# Patient Record
Sex: Female | Born: 1975 | Race: White | Hispanic: No | Marital: Married | Smoking: Former smoker
Health system: Southern US, Community
[De-identification: ages and names within clinical notes are randomized; demographics above are authoritative.]

## PROBLEM LIST (undated history)

## (undated) DIAGNOSIS — T7840XA Allergy, unspecified, initial encounter: Secondary | ICD-10-CM

## (undated) DIAGNOSIS — Q231 Congenital insufficiency of aortic valve: Secondary | ICD-10-CM

## (undated) DIAGNOSIS — F410 Panic disorder [episodic paroxysmal anxiety] without agoraphobia: Secondary | ICD-10-CM

## (undated) DIAGNOSIS — E785 Hyperlipidemia, unspecified: Secondary | ICD-10-CM

## (undated) DIAGNOSIS — I493 Ventricular premature depolarization: Secondary | ICD-10-CM

## (undated) DIAGNOSIS — Q2381 Bicuspid aortic valve: Secondary | ICD-10-CM

## (undated) DIAGNOSIS — I77810 Thoracic aortic ectasia: Secondary | ICD-10-CM

## (undated) DIAGNOSIS — F419 Anxiety disorder, unspecified: Secondary | ICD-10-CM

## (undated) DIAGNOSIS — I359 Nonrheumatic aortic valve disorder, unspecified: Secondary | ICD-10-CM

## (undated) DIAGNOSIS — R011 Cardiac murmur, unspecified: Secondary | ICD-10-CM

## (undated) DIAGNOSIS — F32A Depression, unspecified: Secondary | ICD-10-CM

## (undated) DIAGNOSIS — R87629 Unspecified abnormal cytological findings in specimens from vagina: Secondary | ICD-10-CM

## (undated) DIAGNOSIS — F329 Major depressive disorder, single episode, unspecified: Secondary | ICD-10-CM

## (undated) DIAGNOSIS — Z298 Encounter for other specified prophylactic measures: Secondary | ICD-10-CM

## (undated) DIAGNOSIS — Z2989 Encounter for other specified prophylactic measures: Secondary | ICD-10-CM

## (undated) DIAGNOSIS — I10 Essential (primary) hypertension: Secondary | ICD-10-CM

## (undated) HISTORY — DX: Encounter for other specified prophylactic measures: Z29.89

## (undated) HISTORY — DX: Unspecified abnormal cytological findings in specimens from vagina: R87.629

## (undated) HISTORY — DX: Allergy, unspecified, initial encounter: T78.40XA

## (undated) HISTORY — DX: Thoracic aortic ectasia: I77.810

## (undated) HISTORY — DX: Ventricular premature depolarization: I49.3

## (undated) HISTORY — DX: Essential (primary) hypertension: I10

## (undated) HISTORY — DX: Bicuspid aortic valve: Q23.81

## (undated) HISTORY — DX: Hyperlipidemia, unspecified: E78.5

## (undated) HISTORY — PX: COLPOSCOPY: SHX161

## (undated) HISTORY — DX: Anxiety disorder, unspecified: F41.9

## (undated) HISTORY — PX: OTHER SURGICAL HISTORY: SHX169

## (undated) HISTORY — DX: Congenital insufficiency of aortic valve: Q23.1

## (undated) HISTORY — DX: Panic disorder (episodic paroxysmal anxiety): F41.0

## (undated) HISTORY — DX: Nonrheumatic aortic valve disorder, unspecified: I35.9

## (undated) HISTORY — DX: Cardiac murmur, unspecified: R01.1

## (undated) HISTORY — DX: Encounter for other specified prophylactic measures: Z29.8

---

## 2004-05-24 ENCOUNTER — Ambulatory Visit: Payer: Self-pay | Admitting: Cardiology

## 2004-06-04 ENCOUNTER — Ambulatory Visit: Payer: Self-pay | Admitting: Cardiology

## 2004-06-04 ENCOUNTER — Ambulatory Visit: Payer: Self-pay

## 2004-06-15 ENCOUNTER — Ambulatory Visit: Payer: Self-pay | Admitting: Cardiology

## 2004-06-22 ENCOUNTER — Ambulatory Visit: Payer: Self-pay | Admitting: Cardiology

## 2005-01-28 HISTORY — PX: BREAST CYST ASPIRATION: SHX578

## 2005-09-05 ENCOUNTER — Encounter: Admission: RE | Admit: 2005-09-05 | Discharge: 2005-09-05 | Payer: Self-pay | Admitting: Family Medicine

## 2006-09-08 ENCOUNTER — Ambulatory Visit (HOSPITAL_COMMUNITY): Admission: RE | Admit: 2006-09-08 | Discharge: 2006-09-08 | Payer: Self-pay | Admitting: Obstetrics and Gynecology

## 2006-09-08 ENCOUNTER — Encounter: Payer: Self-pay | Admitting: Obstetrics and Gynecology

## 2007-05-13 ENCOUNTER — Other Ambulatory Visit: Admission: RE | Admit: 2007-05-13 | Discharge: 2007-05-13 | Payer: Self-pay | Admitting: Obstetrics & Gynecology

## 2007-11-24 ENCOUNTER — Ambulatory Visit: Payer: Self-pay | Admitting: Obstetrics & Gynecology

## 2007-11-24 ENCOUNTER — Inpatient Hospital Stay (HOSPITAL_COMMUNITY): Admission: AD | Admit: 2007-11-24 | Discharge: 2007-11-24 | Payer: Self-pay | Admitting: Obstetrics & Gynecology

## 2007-12-04 ENCOUNTER — Inpatient Hospital Stay (HOSPITAL_COMMUNITY): Admission: AD | Admit: 2007-12-04 | Discharge: 2007-12-04 | Payer: Self-pay | Admitting: Obstetrics & Gynecology

## 2007-12-04 ENCOUNTER — Inpatient Hospital Stay (HOSPITAL_COMMUNITY): Admission: AD | Admit: 2007-12-04 | Discharge: 2007-12-07 | Payer: Self-pay | Admitting: Obstetrics & Gynecology

## 2007-12-04 ENCOUNTER — Ambulatory Visit: Payer: Self-pay | Admitting: Obstetrics and Gynecology

## 2009-04-14 LAB — HM PAP SMEAR: HM Pap smear: NORMAL

## 2010-02-07 ENCOUNTER — Encounter: Payer: Self-pay | Admitting: Cardiovascular Disease

## 2010-02-07 ENCOUNTER — Ambulatory Visit
Admission: RE | Admit: 2010-02-07 | Discharge: 2010-02-07 | Payer: Self-pay | Source: Home / Self Care | Attending: Cardiovascular Disease | Admitting: Cardiovascular Disease

## 2010-02-07 ENCOUNTER — Encounter: Payer: Self-pay | Admitting: Cardiology

## 2010-02-07 DIAGNOSIS — Q231 Congenital insufficiency of aortic valve: Secondary | ICD-10-CM | POA: Insufficient documentation

## 2010-02-07 DIAGNOSIS — I4949 Other premature depolarization: Secondary | ICD-10-CM | POA: Insufficient documentation

## 2010-02-22 ENCOUNTER — Ambulatory Visit: Admission: RE | Admit: 2010-02-22 | Discharge: 2010-02-22 | Payer: Self-pay | Source: Home / Self Care

## 2010-02-22 ENCOUNTER — Other Ambulatory Visit: Payer: Self-pay | Admitting: Cardiovascular Disease

## 2010-03-01 NOTE — Assessment & Plan Note (Signed)
Summary: EC6/AMD   Visit Type:  Initial Consult Primary Provider:  Dr. Tanya Nones [facility] Medicine)  CC:  Establish care for leaky valve Nancy Welch), PVC's, and heart murmur.  She has been on medication for the PVC's in the past but not taking anything at this time. Marland Kitchen  History of Present Illness: Nancy Welch is a 35 year old woman who was seen many years ago by [facility] cardiology and at that time had an echocardiogram. Echocardiogram confirmed bicuspid aortic valve by her report. She also was found to have frequent PVCs. She presents to reestablish care. It has been 10 years.  She would like an echocardiogram to reevaluate her aortic valve. She is going to start running significant distances and would like to make sure that everything is okay prior to her heavy training. She has not been having shortness of breath. She does have frequent PVCs. She cannot drink caffeine and has to minimize stress, avoid cold medicines.  EKG shows normal sinus rhythm with rate of 71 beats per minute, no significant ST or T wave changes  Preventive Screening-Counseling & Management  Alcohol-Tobacco     Smoking Status: quit  Caffeine-Diet-Exercise     Does Patient Exercise: yes      Drug Use:  no.    Current Medications (verified): 1)  Errin 0.35 Mg Tabs (Norethindrone) .... One Tablet Once Daily 2)  Sertraline Hcl 50 Mg Tabs (Sertraline Hcl) .... One Tablet Once Daily 3)  Centrum Silver  Tabs (Multiple Vitamins-Minerals) .... One Tablet Once Daily 4)  Vitamin D3 2000 Unit Caps (Cholecalciferol) .... Daily 5)  Fish Oil 1000 Mg Caps (Omega-3 Fatty Acids) .... One Tablet Once Daily  Allergies (verified): No Known Drug Allergies  Past History:  Family History: Last updated: 02/07/2010 Father: Living; prostate cancer age 69 Mother: Living; rheumatoid arthritis age 62  Social History: Last updated: 02/07/2010 Married  Full Time-- Designer, industrial/product. Tobacco Use - Former. Smoked 1/2 PPD x 5 yrs.   Quit 2005. Alcohol Use - yes--rare Regular Exercise - yes--Treadmill 30 minutes of running. Drug Use - no  Risk Factors: Exercise: yes (02/07/2010)  Risk Factors: Smoking Status: quit (02/07/2010)  Past Medical History: heart murmur PVC's  Bi-Valve disorder--Echo 2001 in [place], [place].  Past Surgical History: ovarian cyst --surgical removed (left side)  Family History: Father: Living; prostate cancer age 70 Mother: Living; rheumatoid arthritis age 35  Social History: Married  Full Time-- Designer, industrial/product. Tobacco Use - Former. Smoked 1/2 PPD x 5 yrs.  Quit 2005. Alcohol Use - yes--rare Regular Exercise - yes--Treadmill 30 minutes of running. Drug Use - no Smoking Status:  quit Does Patient Exercise:  yes Drug Use:  no  Review of Systems  The patient denies fever, weight loss, weight gain, vision loss, decreased hearing, hoarseness, chest pain, syncope, dyspnea on exertion, peripheral edema, prolonged cough, abdominal pain, incontinence, muscle weakness, depression, and enlarged lymph nodes.    Vital Signs:  Patient profile:   35 year old female Height:      70 inches Weight:      146 pounds BMI:     21.02 Pulse rate:   71 / minute BP sitting:   124 / 88  (left arm) Cuff size:   regular  Vitals Entered By: Bishop Dublin, CMA (February 07, 2010 3:19 PM)  Physical Exam  General:  Well developed, well nourished, in no acute distress. Head:  normocephalic and atraumatic Neck:  Neck supple, no JVD. No masses, thyromegaly or abnormal cervical nodes. Lungs:  Clear bilaterally to auscultation and percussion. Heart:  Non-displaced PMI, chest non-tender; regular rate and rhythm, S1, S2 with I/VI SEM RSB, no rubs or gallops. Carotid upstroke normal, no bruits, Pedals normal pulses. No edema, no varicosities. Abdomen:  Bowel sounds positive; abdomen soft and non-tender without masses Msk:  Back normal, normal gait. Muscle strength and tone normal. Pulses:  pulses normal in all  4 extremities Extremities:  No clubbing or cyanosis. Neurologic:  Alert and oriented x 3. Skin:  Intact without lesions or rashes. Psych:  Normal affect.   Impression & Recommendations:  Problem # 1:  BICUSPID AORTIC VALVE (ICD-746.4) patient reports a history of bicuspid aortic valve. We will try to obtain old records prior chart. We will order a repeat echocardiogram to evaluate the valve, aortic root appeared she reports a history of aortic valve regurgitation, mild.   Orders: Echocardiogram (Echo)  Problem # 2:  PREMATURE VENTRICULAR CONTRACTIONS (ICD-427.69) She does not want any beta blockers for her PVCs. She is known to work around them and avoid caffeine.  Orders: EKG w/ Interpretation (93000)  Patient Instructions: 1)  Your physician recommends that you follow up as needed. 2)  Your physician has requested that you have an echocardiogram.  Echocardiography is a painless test that uses sound waves to create images of your heart. It provides your doctor with information about the size and shape of your heart and how well your heart's chambers and valves are working.  This procedure takes approximately one hour. There are no restrictions for this procedure.

## 2010-03-01 NOTE — Letter (Signed)
Summary: Medical Record Release  Medical Record Release   Imported By: Harlon Flor 02/20/2010 15:05:01  _____________________________________________________________________  External Attachment:    Type:   Image     Comment:   External Document

## 2010-03-01 NOTE — Letter (Signed)
Summary: Medical Record Release  Medical Record Release   Imported By: Harlon Flor 02/20/2010 15:21:17  _____________________________________________________________________  External Attachment:    Type:   Image     Comment:   External Document

## 2010-03-20 ENCOUNTER — Encounter: Payer: Self-pay | Admitting: Cardiovascular Disease

## 2010-03-21 NOTE — Letter (Signed)
Summary: Medical Record Release - [facility]  Medical Record Release - Marai Betka   Imported By: Harlon Flor 03/15/2010 09:57:57  _____________________________________________________________________  External Attachment:    Type:   Image     Comment:   External Document

## 2010-03-27 NOTE — Letter (Signed)
Summary: Generic Engineer, agricultural at [address]   [place], [place] [address]   Phone: [phone]  Fax: [phone]        March 20, 2010 MRN: [id number]    [place] [address] [place], [place]  [address]    Dear Ms. PATTERSON,   Dr.Brentlee Sciara said you are cleared for exercise. If you have any questions or concerns feel free to give me a call back at [phone].         Sincerely,  Lysbeth Galas, CMA  This letter has been electronically signed by your physician.

## 2010-06-12 NOTE — H&P (Signed)
NAMEKRITIKA, STUKES            ACCOUNT NO.:  [id number]   MEDICAL RECORD NO.:  [id number]          PATIENT TYPE:  AMB   LOCATION:  DAY                           FACILITY:  [facility]   PHYSICIAN:  Tilda Burrow, M.D. DATE OF BIRTH:  January 24, 1976   DATE OF ADMISSION:  09/08/2006  DATE OF DISCHARGE:  LH                              HISTORY & PHYSICAL   ADMISSION DIAGNOSIS:  Pregnancy at 67 weeks' gestation, missed abortion  (miscarriage).   HISTORY OF PRESENT ILLNESS:  This 35 year old female, G2, P0, AB1, LMP  Jun 18, 2006, placing [facility] March 26, 2007, is seen in our office on  September 05, 2006, where ultrasound confirms missed abortion with a crown-  rump length of 2.2 cm, absent fetal heart motion on ultrasound.  There  has been essentially no fetal growth since August 22, 2006, when the fetal  heart motion was clearly present.  The patient understands the  inevitability pregnancy loss, has considered her options and desires to  proceed as quick as possible to suction D&C.  This is scheduled for 10  a.m. on September 08, 2006, at [facility].   PRENATAL COURSE:  Prenatal course has been uneventful with blood type O  positive, urine drug screen negative, varicella immunity present, RPR  nonreactive, rubella immunity present, hemoglobin 13, hematocrit 39.  Hepatitis negative, HIV nonreactive, RPR nonreactive.  Pap smear Class  I.  GC and chlamydia culture performed September 05, 2006.   PAST MEDICAL HISTORY:  Negative.   PAST SURGICAL HISTORY:  1. Left ovarian cyst removed in the past.  2. Spontaneous miscarriage in March 2008, for first pregnancy.   PAST OBSTETRICAL HISTORY:  She is a G2, P0-0-1-0.   SOCIAL HISTORY:  She is married.  Works at [employer] in [place].   PHYSICAL EXAMINATION:  VITAL SIGNS:  Physical exam performed by Dr. Despina Hidden  shows height 5 feet 10 inches, weight 149, blood pressure 100/80.  HEENT:  Pupils equal, round and reactive.  Extraocular intact.  NECK:   Supple.  CHEST:  Clear to auscultation.  ABDOMEN:  Nontender.  PELVIC:  Cervix closed.  Uterus anteflexed with large gestational sac  with 2.2 cm fetal pole with absent fetal heart motion and smaller than  normal yoke sac.   PLAN:  Suction D&C on September 08, 2006.      Tilda Burrow, M.D.  Electronically Signed     JVF/MEDQ  D:  09/05/2006  T:  09/05/2006  Job:  [id number]   cc:   [facility]  Fax: [phone]   [facility]

## 2010-06-12 NOTE — Op Note (Signed)
[name], [name]            ACCOUNT NO.:  [id number]   MEDICAL RECORD NO.:  [id number]          PATIENT TYPE:  AMB   LOCATION:  DAY                           FACILITY:  [facility]   PHYSICIAN:  Tilda Burrow, M.D. DATE OF BIRTH:  1975/10/30   DATE OF PROCEDURE:  09/08/2006  DATE OF DISCHARGE:                               OPERATIVE REPORT   PREOPERATIVE DIAGNOSIS:  Missed abortion, [redacted] weeks gestation.   POSTOPERATIVE DIAGNOSIS:  Missed abortion, [redacted] weeks gestation.   PROCEDURE:  Suction, dilation and curettage.   SURGEON:  Tilda Burrow, M.D.   ASSISTANT:  None.   ANESTHESIA:  General anesthesia with LMA anesthesia.   COMPLICATIONS:  None.   FINDINGS:  Retroverted uterus sounding to 11 cm pre-procedure and post-  procedure.  A generous amount of fluid consistent with ultrasound, as  well as products of conception.  Soft boggy uterus with increased  tendency to bleed.  No suspicion of perforation.   DESCRIPTION OF PROCEDURE:  The patient was taken to the operating room  and prepped and draped for a vaginal procedure.  A single tooth  tenaculum was used to grasp the cervix.  She had an elongated vaginal  length.  The cervix was grasped.  Prior to this an examination under  anesthesia confirmed the uterus to be in a slightly retroverted position  and consistent with approximately 11 weeks size.  The uterus sounded to  11 cm, allowed serial dilation to 31-French with easy entry of the 9 mm  curved suction curet into the uterine cavity.  A suction curettage was  then performed in a circumferential fashion obtaining a large amount of  fluid and membrane remnants.  This portion of the procedure went very  straightforward and we extracted the membrane fragments as expected.  The uterus stayed rather soft and floppy and did not contract down as  much as before.  The curettage was repeated x1.  No additional tissue  was obtained.  The uterus had a somewhat heart-shaped contour  to tactile  sensation.  The smooth sharp curettage was then introduced and a brief  curettage performed in four quadrants, obtaining a generous amount of  blood but no tissue fragments and no sensation of rough tissue.  IV  Oxytocin was administered due to the tendency toward a boggy floppy  uterus.  This seemed to improve the oozing which was greater than  normal, with an estimated blood loss of 200 mL.  Repeat examination  under anesthesia confirmed that the uterus was indeed in a similar  position as before and subjectively slightly smaller, but softer than is  the case in many patients.  This was consider a normal variation.   The patient was therefore sent to the recovery room.  Prior to  completing the procedure, a para-cervical block with 20 mL of Marcaine  solution was performed.  The bleeding was essentially stopped by the  time she went to the recovery room, where she was examined and was quite  comfortable, with the sponge and needle counts correct.   ADDENDUM:  Her blood type is confirmed as  Rh, O-positive.      Tilda Burrow, M.D.  Electronically Signed     JVF/MEDQ  D:  09/08/2006  T:  09/09/2006  Job:  [id number]

## 2010-07-31 ENCOUNTER — Encounter: Payer: Self-pay | Admitting: Family Medicine

## 2010-07-31 DIAGNOSIS — Z298 Encounter for other specified prophylactic measures: Secondary | ICD-10-CM | POA: Insufficient documentation

## 2010-07-31 DIAGNOSIS — F419 Anxiety disorder, unspecified: Secondary | ICD-10-CM | POA: Insufficient documentation

## 2010-07-31 DIAGNOSIS — I493 Ventricular premature depolarization: Secondary | ICD-10-CM | POA: Insufficient documentation

## 2010-08-16 ENCOUNTER — Encounter: Payer: Self-pay | Admitting: Cardiovascular Disease

## 2010-10-30 LAB — URINALYSIS, ROUTINE W REFLEX MICROSCOPIC
Bilirubin Urine: NEGATIVE
Glucose, UA: NEGATIVE
Ketones, ur: 15 — AB
Leukocytes, UA: NEGATIVE
Nitrite: NEGATIVE
Protein, ur: NEGATIVE
Specific Gravity, Urine: 1.01
Urobilinogen, UA: 0.2
pH: 7.5

## 2010-10-30 LAB — CBC
HCT: 35.9 — ABNORMAL LOW
HCT: 38
Hemoglobin: 12
Hemoglobin: 12.7
MCHC: 33.4
MCHC: 33.5
MCV: 89.4
MCV: 89.4
Platelets: 220
Platelets: 236
RBC: 4.02
RBC: 4.25
RDW: 14.5
RDW: 14.7
WBC: 15.8 — ABNORMAL HIGH
WBC: 9.6

## 2010-10-30 LAB — WET PREP, GENITAL
Clue Cells Wet Prep HPF POC: NONE SEEN
Trich, Wet Prep: NONE SEEN
Yeast Wet Prep HPF POC: NONE SEEN

## 2010-10-30 LAB — RPR
RPR Ser Ql: NONREACTIVE
RPR Ser Ql: NONREACTIVE

## 2010-10-30 LAB — COMPREHENSIVE METABOLIC PANEL
ALT: 10
AST: 17
Albumin: 2.8 — ABNORMAL LOW
Alkaline Phosphatase: 122 — ABNORMAL HIGH
BUN: 3 — ABNORMAL LOW
CO2: 24
Calcium: 9
Chloride: 103
Creatinine, Ser: 0.46
GFR calc Af Amer: >60
GFR calc non Af Amer: >60
Glucose, Bld: 92
Potassium: 3.7
Sodium: 134 — ABNORMAL LOW
Total Bilirubin: 0.9
Total Protein: 5.7 — ABNORMAL LOW

## 2010-10-30 LAB — URINE MICROSCOPIC-ADD ON

## 2010-10-30 LAB — URIC ACID: Uric Acid, Serum: 3.7

## 2010-11-12 LAB — HEMOGLOBIN AND HEMATOCRIT, BLOOD
HCT: 36
Hemoglobin: 12.5

## 2012-04-05 ENCOUNTER — Emergency Department (HOSPITAL_COMMUNITY)
Admission: EM | Admit: 2012-04-05 | Discharge: 2012-04-06 | Disposition: A | Discharge status: Home / Self Care | Payer: No Typology Code available for payment source | Attending: Emergency Medicine | Admitting: Emergency Medicine

## 2012-04-05 ENCOUNTER — Encounter (HOSPITAL_COMMUNITY): Payer: Self-pay | Admitting: Emergency Medicine

## 2012-04-05 DIAGNOSIS — I4949 Other premature depolarization: Secondary | ICD-10-CM | POA: Insufficient documentation

## 2012-04-05 DIAGNOSIS — I359 Nonrheumatic aortic valve disorder, unspecified: Secondary | ICD-10-CM | POA: Insufficient documentation

## 2012-04-05 DIAGNOSIS — Z79899 Other long term (current) drug therapy: Secondary | ICD-10-CM | POA: Insufficient documentation

## 2012-04-05 DIAGNOSIS — F411 Generalized anxiety disorder: Secondary | ICD-10-CM | POA: Insufficient documentation

## 2012-04-05 DIAGNOSIS — F329 Major depressive disorder, single episode, unspecified: Secondary | ICD-10-CM | POA: Insufficient documentation

## 2012-04-05 DIAGNOSIS — R011 Cardiac murmur, unspecified: Secondary | ICD-10-CM | POA: Insufficient documentation

## 2012-04-05 DIAGNOSIS — Z87891 Personal history of nicotine dependence: Secondary | ICD-10-CM | POA: Insufficient documentation

## 2012-04-05 DIAGNOSIS — S46909A Unspecified injury of unspecified muscle, fascia and tendon at shoulder and upper arm level, unspecified arm, initial encounter: Secondary | ICD-10-CM | POA: Insufficient documentation

## 2012-04-05 DIAGNOSIS — S161XXA Strain of muscle, fascia and tendon at neck level, initial encounter: Secondary | ICD-10-CM

## 2012-04-05 DIAGNOSIS — F3289 Other specified depressive episodes: Secondary | ICD-10-CM | POA: Insufficient documentation

## 2012-04-05 DIAGNOSIS — Y92410 Unspecified street and highway as the place of occurrence of the external cause: Secondary | ICD-10-CM | POA: Insufficient documentation

## 2012-04-05 DIAGNOSIS — S139XXA Sprain of joints and ligaments of unspecified parts of neck, initial encounter: Secondary | ICD-10-CM | POA: Insufficient documentation

## 2012-04-05 DIAGNOSIS — Y9389 Activity, other specified: Secondary | ICD-10-CM | POA: Insufficient documentation

## 2012-04-05 DIAGNOSIS — Z8679 Personal history of other diseases of the circulatory system: Secondary | ICD-10-CM | POA: Insufficient documentation

## 2012-04-05 DIAGNOSIS — S4980XA Other specified injuries of shoulder and upper arm, unspecified arm, initial encounter: Secondary | ICD-10-CM | POA: Insufficient documentation

## 2012-04-05 HISTORY — DX: Depression, unspecified: F32.A

## 2012-04-05 HISTORY — DX: Major depressive disorder, single episode, unspecified: F32.9

## 2012-04-05 MED ORDER — OXYCODONE-ACETAMINOPHEN 5-325 MG PO TABS
2.0000 | ORAL_TABLET | Freq: Once | ORAL | Status: AC
  Administered 2012-04-06: 2 via ORAL
  Filled 2012-04-05: qty 2

## 2012-04-05 MED ORDER — DIAZEPAM 5 MG PO TABS: 5.0000 mg | ORAL_TABLET | Freq: Four times a day (QID) | ORAL | Status: DC | PRN

## 2012-04-05 MED ORDER — DIAZEPAM 5 MG PO TABS
5.0000 mg | ORAL_TABLET | Freq: Once | ORAL | Status: AC
  Administered 2012-04-06: 5 mg via ORAL
  Filled 2012-04-05: qty 1

## 2012-04-05 MED ORDER — IBUPROFEN 600 MG PO TABS: 600.0000 mg | ORAL_TABLET | Freq: Four times a day (QID) | ORAL | Status: DC | PRN

## 2012-04-05 MED ORDER — KETOROLAC TROMETHAMINE 30 MG/ML IJ SOLN
30.0000 mg | Freq: Once | INTRAMUSCULAR | Status: AC
  Administered 2012-04-06: 30 mg via INTRAMUSCULAR
  Filled 2012-04-05: qty 1

## 2012-04-05 MED ORDER — OXYCODONE-ACETAMINOPHEN 5-325 MG PO TABS: 2.0000 | ORAL_TABLET | Freq: Once | ORAL | Status: DC
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
## 2012-04-06 NOTE — ED Provider Notes (Signed)
Medical screening examination/treatment/procedure(s) were performed by non-physician practitioner and as supervising physician I was immediately available for consultation/collaboration.  Doug Sou, MD 04/06/12 [phone]

## 2012-04-21 ENCOUNTER — Ambulatory Visit (INDEPENDENT_AMBULATORY_CARE_PROVIDER_SITE_OTHER): Payer: 59 | Admitting: Family Medicine

## 2012-04-21 ENCOUNTER — Encounter: Payer: Self-pay | Admitting: Family Medicine

## 2012-04-21 VITALS — BP 110/68 | HR 93 | Temp 98.3°F | Resp 16 | Wt 152.0 lb

## 2012-04-21 DIAGNOSIS — S139XXA Sprain of joints and ligaments of unspecified parts of neck, initial encounter: Secondary | ICD-10-CM

## 2012-04-21 DIAGNOSIS — F0781 Postconcussional syndrome: Secondary | ICD-10-CM

## 2012-04-21 DIAGNOSIS — S060X0A Concussion without loss of consciousness, initial encounter: Secondary | ICD-10-CM

## 2012-04-21 DIAGNOSIS — S134XXA Sprain of ligaments of cervical spine, initial encounter: Secondary | ICD-10-CM
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
## 2012-04-21 NOTE — Patient Instructions (Signed)
Rest. Out of work the next week. Take motrin 800 3x/day for pain. Recheck in2 weeks if no better, sooner if worse.

## 2012-12-31 ENCOUNTER — Encounter (HOSPITAL_COMMUNITY): Payer: Self-pay | Admitting: *Deleted

## 2012-12-31 ENCOUNTER — Inpatient Hospital Stay (HOSPITAL_COMMUNITY): Payer: 59

## 2012-12-31 ENCOUNTER — Encounter (HOSPITAL_COMMUNITY): Payer: 59 | Admitting: Anesthesiology

## 2012-12-31 ENCOUNTER — Inpatient Hospital Stay (HOSPITAL_COMMUNITY): Payer: 59 | Admitting: Anesthesiology

## 2012-12-31 ENCOUNTER — Ambulatory Visit (HOSPITAL_COMMUNITY)
Admission: AD | Admit: 2012-12-31 | Discharge: 2012-12-31 | Disposition: A | Discharge status: Home / Self Care | Payer: 59 | Source: Ambulatory Visit | Attending: Obstetrics and Gynecology | Admitting: Obstetrics and Gynecology

## 2012-12-31 ENCOUNTER — Encounter (HOSPITAL_COMMUNITY)
Admission: AD | Disposition: A | Discharge status: Home / Self Care | Payer: Self-pay | Source: Ambulatory Visit | Attending: Obstetrics and Gynecology

## 2012-12-31 DIAGNOSIS — O031 Delayed or excessive hemorrhage following incomplete spontaneous abortion: Secondary | ICD-10-CM | POA: Insufficient documentation

## 2012-12-31 DIAGNOSIS — O034 Incomplete spontaneous abortion without complication: Secondary | ICD-10-CM

## 2012-12-31 HISTORY — PX: DILATION AND EVACUATION: SHX1459

## 2012-12-31 LAB — TYPE AND SCREEN
ABO/RH(D): O POS
Antibody Screen: NEGATIVE

## 2012-12-31 LAB — CBC
HCT: 28.6 % — ABNORMAL LOW (ref 36.0–46.0)
Hemoglobin: 9.6 g/dL — ABNORMAL LOW (ref 12.0–15.0)
MCH: 29.1 pg (ref 26.0–34.0)
MCHC: 33.6 g/dL (ref 30.0–36.0)
MCV: 86.7 fL (ref 78.0–100.0)
Platelets: 181 10*3/uL (ref 150–400)
RBC: 3.3 MIL/uL — ABNORMAL LOW (ref 3.87–5.11)
RDW: 12.8 % (ref 11.5–15.5)
WBC: 8.8 10*3/uL (ref 4.0–10.5)

## 2012-12-31 LAB — ABO/RH: ABO/RH(D): O POS

## 2012-12-31 LAB — HCG, QUANTITATIVE, PREGNANCY: hCG, Beta Chain, Quant, S: 26 m[IU]/mL — ABNORMAL HIGH (ref <5)

## 2012-12-31 SURGERY — DILATION AND EVACUATION, UTERUS
Anesthesia: Monitor Anesthesia Care | Site: Vagina

## 2012-12-31 MED ORDER — HYDROCODONE-ACETAMINOPHEN 5-325 MG PO TABS: 1.0000 | ORAL_TABLET | Freq: Four times a day (QID) | ORAL | Status: DC | PRN

## 2012-12-31 MED ORDER — ONDANSETRON HCL 4 MG/2ML IJ SOLN
INTRAMUSCULAR | Status: DC | PRN
  Administered 2012-12-31: 4 mg via INTRAVENOUS

## 2012-12-31 MED ORDER — MIDAZOLAM HCL 2 MG/2ML IJ SOLN
INTRAMUSCULAR | Status: DC | PRN
  Administered 2012-12-31: 1 mg via INTRAVENOUS

## 2012-12-31 MED ORDER — FAMOTIDINE IN NACL 20-0.9 MG/50ML-% IV SOLN
20.0000 mg | Freq: Once | INTRAVENOUS | Status: AC
  Administered 2012-12-31: 20 mg via INTRAVENOUS
  Filled 2012-12-31: qty 50

## 2012-12-31 MED ORDER — FENTANYL CITRATE 0.05 MG/ML IJ SOLN
25.0000 ug | INTRAMUSCULAR | Status: DC | PRN
  Administered 2012-12-31 (×2): 50 ug via INTRAVENOUS

## 2012-12-31 MED ORDER — HYDROCODONE-ACETAMINOPHEN 5-325 MG PO TABS
ORAL_TABLET | ORAL | Status: AC
  Filled 2012-12-31: qty 2

## 2012-12-31 MED ORDER — LACTATED RINGERS IV SOLN
INTRAVENOUS | Status: DC | PRN
  Administered 2012-12-31 (×3): via INTRAVENOUS

## 2012-12-31 MED ORDER — LIDOCAINE HCL (CARDIAC) 20 MG/ML IV SOLN
INTRAVENOUS | Status: DC | PRN
  Administered 2012-12-31: 20 mg via INTRAVENOUS

## 2012-12-31 MED ORDER — PROPOFOL INFUSION 10 MG/ML OPTIME
INTRAVENOUS | Status: DC | PRN
  Administered 2012-12-31: 75 ug/kg/min via INTRAVENOUS

## 2012-12-31 MED ORDER — FENTANYL CITRATE 0.05 MG/ML IJ SOLN
INTRAMUSCULAR | Status: DC | PRN
  Administered 2012-12-31 (×2): 50 ug via INTRAVENOUS

## 2012-12-31 MED ORDER — FENTANYL CITRATE 0.05 MG/ML IJ SOLN
INTRAMUSCULAR | Status: AC
  Administered 2012-12-31: 50 ug via INTRAVENOUS
  Filled 2012-12-31: qty 2

## 2012-12-31 MED ORDER — LIDOCAINE HCL 1 % IJ SOLN
INTRAMUSCULAR | Status: DC | PRN
  Administered 2012-12-31: 19 mL

## 2012-12-31 MED ORDER — PROPOFOL 10 MG/ML IV EMUL
INTRAVENOUS | Status: DC | PRN
  Administered 2012-12-31 (×5): 20 mg via INTRAVENOUS

## 2012-12-31 MED ORDER — HYDROCODONE-ACETAMINOPHEN 5-325 MG PO TABS
2.0000 | ORAL_TABLET | Freq: Once | ORAL | Status: AC
  Administered 2012-12-31: 2 via ORAL

## 2012-12-31 MED ORDER — SODIUM CHLORIDE 0.9 % IV BOLUS (SEPSIS)
1000.0000 mL | Freq: Once | INTRAVENOUS | Status: AC
  Administered 2012-12-31: 1000 mL via INTRAVENOUS

## 2012-12-31 SURGICAL SUPPLY — 21 items
CATH ROBINSON RED A/P 16FR (CATHETERS) ×2 IMPLANT
CLOTH BEACON ORANGE TIMEOUT ST (SAFETY) ×2 IMPLANT
DECANTER SPIKE VIAL GLASS SM (MISCELLANEOUS) ×2 IMPLANT
GLOVE ECLIPSE 9.0 STRL (GLOVE) ×4 IMPLANT
GOWN STRL REIN 3XL LVL4 (GOWN DISPOSABLE) ×2 IMPLANT
GOWN STRL REIN XL XLG (GOWN DISPOSABLE) ×4 IMPLANT
KIT BERKELEY 1ST TRIMESTER 3/8 (MISCELLANEOUS) ×2 IMPLANT
NDL SPNL 22GX3.5 QUINCKE BK (NEEDLE) ×1 IMPLANT
NEEDLE SPNL 22GX3.5 QUINCKE BK (NEEDLE) ×2 IMPLANT
NS IRRIG 1000ML POUR BTL (IV SOLUTION) ×2 IMPLANT
PACK VAGINAL MINOR WOMEN LF (CUSTOM PROCEDURE TRAY) ×2 IMPLANT
PAD OB MATERNITY 4.3X12.25 (PERSONAL CARE ITEMS) ×2 IMPLANT
PAD PREP 24X48 CUFFED NSTRL (MISCELLANEOUS) ×2 IMPLANT
SET BERKELEY SUCTION TUBING (SUCTIONS) ×2 IMPLANT
SYR CONTROL 10ML LL (SYRINGE) ×2 IMPLANT
TOWEL OR 17X24 6PK STRL BLUE (TOWEL DISPOSABLE) ×4 IMPLANT
VACURETTE 10 RIGID CVD (CANNULA) IMPLANT
VACURETTE 12 RIGID CVD (CANNULA) IMPLANT
VACURETTE 7MM CVD STRL WRAP (CANNULA) IMPLANT
VACURETTE 8 RIGID CVD (CANNULA) IMPLANT
VACURETTE 9 RIGID CVD (CANNULA) ×1 IMPLANT

## 2012-12-31 NOTE — Anesthesia Preprocedure Evaluation (Signed)
Anesthesia Evaluation  Patient identified by MRN, date of birth, ID band Patient awake    Reviewed: Allergy & Precautions, H&P , Patient's Chart, lab work & pertinent test results, reviewed documented beta blocker date and time   Airway Mallampati: II TM Distance: >3 FB Neck ROM: full    Dental no notable dental hx.    Pulmonary former smoker,  breath sounds clear to auscultation  Pulmonary exam normal       Cardiovascular Rhythm:regular Rate:Normal     Neuro/Psych    GI/Hepatic   Endo/Other    Renal/GU      Musculoskeletal   Abdominal   Peds  Hematology   Anesthesia Other Findings   Reproductive/Obstetrics                           Anesthesia Physical Anesthesia Plan  ASA: II  Anesthesia Plan: MAC   Post-op Pain Management:    Induction: Intravenous  Airway Management Planned: LMA, Mask and Natural Airway  Additional Equipment:   Intra-op Plan:   Post-operative Plan:   Informed Consent: I have reviewed the patients History and Physical, chart, labs and discussed the procedure including the risks, benefits and alternatives for the proposed anesthesia with the patient or authorized representative who has indicated his/her understanding and acceptance.   Dental Advisory Given  Plan Discussed with: CRNA and Surgeon  Anesthesia Plan Comments:         Anesthesia Quick Evaluation

## 2012-12-31 NOTE — MAU Note (Addendum)
PT HAS ARRIVED VIA EMS-  SHE THINKS SHE HAD A SAB ABOUT A MTH AGO.   BUT TONIGHT AT 2300 SHE STARTED BLEEDING HEAVY.  ON ARRIVAL -  SHE HAD DRIED BLOOD ON LEGS AND FEET.  NO VAG BLEEDING  NOW.  OB-GYN- IN [place]

## 2012-12-31 NOTE — Op Note (Signed)
Detail of procedure: Pt taken to OR, iv sedaation and paracervical block injected. Time out had been done Cervix dilated to 27 french 9 mm curved suction currettage done in all quadrants, circumferential technique Smooth curettage confirms empty uterus Pt to recovery room in good condition.

## 2012-12-31 NOTE — MAU Note (Signed)
BEDSIDE U/S.  DR Emelda Fear IN ROOM.

## 2012-12-31 NOTE — Anesthesia Postprocedure Evaluation (Signed)
  Anesthesia Post-op Note  Patient: Nancy Welch  Procedure(s) Performed: Procedure(s): DILATATION AND EVACUATION (N/A) Patient is awake and responsive. Pain and nausea are reasonably well controlled. Vital signs are stable and clinically acceptable. Oxygen saturation is clinically acceptable. There are no apparent anesthetic complications at this time. Patient is ready for discharge.

## 2012-12-31 NOTE — Transfer of Care (Signed)
Immediate Anesthesia Transfer of Care Note  Patient: Nancy Welch  Procedure(s) Performed: Procedure(s): DILATATION AND EVACUATION (N/A)  Patient Location: PACU  Anesthesia Type:MAC  Level of Consciousness: awake, alert , oriented and patient cooperative  Airway & Oxygen Therapy: Patient Spontanous Breathing  Post-op Assessment: Report given to PACU RN and Post -op Vital signs reviewed and stable  Post vital signs: Reviewed and stable  Complications: No apparent anesthesia complications

## 2012-12-31 NOTE — MAU Provider Note (Signed)
Chief Complaint: No chief complaint on file. Heavy vaginal bleeding after sex, first time sex since SAB 1 month ago.  First Provider Initiated Contact with Patient 12/31/12 0135     SUBJECTIVE HPI: Nancy Welch is a 37 y.o. G4P0030 at Unknown by LMP who presents with   PT HAS ARRIVED VIA EMS- SHE THINKS SHE HAD A SAB ABOUT A MTH AGO. BUT TONIGHT AT 2300 SHE STARTED BLEEDING HEAVY. ON ARRIVAL - SHE HAD DRIED BLOOD ON LEGS AND FEET. NO VAG BLEEDING NOW. OB-GYN- IN [place]   Past Medical History  Diagnosis Date  . PVC's (premature ventricular contractions)   . Anxiety   . SBE (subacute bacterial endocarditis) prophylaxis candidate   . Heart murmur   . 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 Depression    OB History  Gravida Para Term Preterm AB SAB TAB Ectopic Multiple Living  4 1   3 3         # Outcome Date GA Lbr Len/2nd Weight Sex Delivery Anes PTL Lv  4 SAB           3 PAR      SVD     2 SAB           1 SAB              Past Surgical History  Procedure Laterality Date  . Ovarian cyst removed      L side    History   Social History  . Marital Status: Married    Spouse Name: N/A    Number of Children: N/A  . Years of Education: N/A   Occupational History  . Not on file.   Social History Main Topics  . Smoking status: Former Games developer  . Smokeless tobacco: Not on file     Comment: smoked 1/2 ppd for 5 years; quit in 2005   . Alcohol Use: Yes     Comment: rare  . Drug Use: No  . Sexual Activity: Not on file   Other Topics Concern  . Not on file   Social History Narrative   Married; full time - lab tech; 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    No current facility-administered medications on file prior to encounter.   Current Outpatient Prescriptions on File Prior to Encounter  Medication Sig Dispense Refill  . ALPRAZolam (XANAX) 0.25 MG tablet Take 0.25 mg by mouth at  bedtime as needed.        . Cholecalciferol (VITAMIN D3) 2000 UNITS capsule Take 2,000 Units by mouth daily.        Marland Kitchen desogestrel-ethinyl estradiol (KARIVA) 0.15-0.02/0.01 MG (21/5) per tablet Take 1 tablet by mouth daily.        Marland Kitchen ibuprofen (ADVIL,MOTRIN) 600 MG tablet Take 1 tablet (600 mg total) by mouth every 6 (six) hours as needed for pain.  30 tablet  0  . Multiple Vitamins-Minerals (CENTRUM SILVER PO) Take 1 tablet by mouth daily.        . sertraline (ZOLOFT) 50 MG tablet Take 50 mg by mouth daily.         Allergies  Allergen Reactions  . Escitalopram Oxalate Nausea Only    Dizziness/ vertigo.     ROS: Pertinent items in HPI  OBJECTIVE Blood pressure 117/78, pulse 76, temperature 99.1 F (37.3 C), temperature source Oral, resp. rate 20. GENERAL: Well-developed, well-nourished female in  no acute distress.  HEENT: Normocephalic HEART: normal rate RESP: normal effort ABDOMEN: Soft, non-tender EXTREMITIES: Nontender, no edema NEURO: Alert and oriented SPECULUM EXAM: NEFG, physiologic discharge, no blood noted, cervix clean, NO LACERATIONS, not actively bleeding at present. BIMANUAL: cervix closed, ; uterus normal size, retroverted, no adnexal tenderness or masses  LAB RESULTS No results found for this or any previous visit (from the past 24 hour(s)).  IMAGING No results found.  MAU COURSE CBC, type and screen, Quant, bedside ultrasound, and n.p.o.  ASSESSMENt: Acute blood loss s/p retained POC's , s/p recent miscarriage. No diagnosis found.  PLAN NPO IV hydration. Suction DILATION AND Curettage this A.M.    Medication List    ASK your doctor about these medications       ALPRAZolam 0.25 MG tablet  Commonly known as:  XANAX  Take 0.25 mg by mouth at bedtime as needed.     CENTRUM SILVER PO  Take 1 tablet by mouth daily.     desogestrel-ethinyl estradiol 0.15-0.02/0.01 MG (21/5) tablet  Commonly known as:  KARIVA,AZURETTE,MIRCETTE  Take 1 tablet by mouth  daily.     ibuprofen 600 MG tablet  Commonly known as:  ADVIL,MOTRIN  Take 1 tablet (600 mg total) by mouth every 6 (six) hours as needed for pain.     sertraline 50 MG tablet  Commonly known as:  ZOLOFT  Take 50 mg by mouth daily.     Vitamin D3 2000 UNITS capsule  Take 2,000 Units by mouth daily.         [place], CNM 12/31/2012  1:36 AM

## 2012-12-31 NOTE — Brief Op Note (Signed)
12/31/2012  3:58 AM  PATIENT:  Nancy Welch  37 y.o. female  PRE-OPERATIVE DIAGNOSIS:  Retained Products  POST-OPERATIVE DIAGNOSIS:  Retained Products  PROCEDURE:  Procedure(s): DILATATION AND EVACUATION (N/A)  SURGEON:  Surgeon(s) and Role:    * Tilda Burrow, MD - Primary  PHYSICIAN ASSISTANT:   ASSISTANTS: none   ANESTHESIA:   local and IV sedation  EBL:  Total I/O In: -  Out: 250 [Urine:250]  BLOOD ADMINISTERED:none Op Positive  DRAINS: none   LOCAL MEDICATIONS USED:  LIDOCAINE  and Amount: 18 ml  SPECIMEN:  Source of Specimen:  POC"S  DISPOSITION OF SPECIMEN:  PATHOLOGY  COUNTS:  YES  TOURNIQUET:  * No tourniquets in log *  DICTATION: .Dragon Dictation  PLAN OF CARE: Discharge to home after PACU  PATIENT DISPOSITION:  PACU - hemodynamically stable.   Delay start of Pharmacological VTE agent (>24hrs) due to surgical blood loss or risk of bleeding: not applicable

## 2013-01-01 ENCOUNTER — Encounter (HOSPITAL_COMMUNITY): Payer: Self-pay | Admitting: Obstetrics and Gynecology

## 2013-02-23 ENCOUNTER — Other Ambulatory Visit: Payer: Self-pay | Admitting: Physician Assistant

## 2013-02-24 NOTE — Telephone Encounter (Signed)
OK refill?? 

## 2013-02-24 NOTE — Telephone Encounter (Signed)
Request from mail order pharmacy service.  Refill denied.  Trying to reach patient to make appt here or with GYN for refill of BCP due to recent GYN issues seen in chart.  NTBS first.

## 2013-02-24 NOTE — Telephone Encounter (Signed)
When was her last office visit that included Pelvic exam?  Needs to have this every 12 months.

## 2013-04-16 ENCOUNTER — Encounter: Payer: Self-pay | Admitting: Family Medicine

## 2013-04-16 ENCOUNTER — Ambulatory Visit (INDEPENDENT_AMBULATORY_CARE_PROVIDER_SITE_OTHER): Payer: 59 | Admitting: Family Medicine

## 2013-04-16 VITALS — BP 132/64 | HR 68 | Temp 98.1°F | Resp 14 | Ht 69.0 in | Wt 148.0 lb

## 2013-04-16 DIAGNOSIS — J069 Acute upper respiratory infection, unspecified: Secondary | ICD-10-CM

## 2013-04-16 MED ORDER — AZITHROMYCIN 250 MG PO TABS: ORAL_TABLET | ORAL | Status: DC

## 2013-04-16 NOTE — Progress Notes (Signed)
Patient ID: Nancy Welch, female   DOB: [birth year], 38 y.o.   MRN: [id number]   Subjective:    Patient ID: Nancy Welch, female    DOB: [birth year], 38 y.o.   MRN: [id number]  Patient presents for Illness  patient here with cough with production of yellow sputum low-grade fever, he still drainage for the past 5 days. She was treated for presumptive influenza back in February and she improved at that time. She is positive sick contact with her daughter. She also notes that she heard some wheezing the other day but she does not have any history of asthma or bronchitis    Review Of Systems:  GEN- denies fatigue+, fever, weight loss,weakness,+ recent illness HEENT- denies eye drainage, change in vision,+ nasal discharge, CVS- denies chest pain, palpitations RESP- denies SOB, +cough, wheeze ABD- denies N/V, change in stools, abd pain GU- denies dysuria, hematuria, dribbling, incontinence MSK- denies joint pain, muscle aches, injury Neuro- denies headache, dizziness, syncope, seizure activity        Objective:    BP 132/64  Pulse 68  Temp(Src) 98.1 F (36.7 C)  Resp 14  Ht 5\' 9"  (1.753 m)  Wt 148 lb (67.132 kg)  BMI 21.85 kg/m2 GEN- NAD, alert and oriented x3 HEENT- PERRL, EOMI, non injected sclera, pink conjunctiva, MMM, oropharynx clear, mild  Maxillary sinus tenderness, nares clear rhinorrhea,  Neck- Supple, Shotty LAD CVS- RRR, no murmur RESP-mild congestion clears with cough, no wheeze, normal WOB EXT- No edema Pulses- Radial, DP- 2+        Assessment & Plan:      Problem List Items Addressed This Visit   None    Visit Diagnoses   Acute URI    -  Primary    Relevant Medications       azithromycin (ZITHROMAX) tablet             Mucinex DM, nasal saline, humidifier    If no improvement obtain CXR with recent influenza and now second illness    Note: This dictation was prepared with Dragon dictation along with smaller phrase technology. Any transcriptional  errors that result from this process are unintentional.

## 2013-04-16 NOTE — Patient Instructions (Signed)
Use nasal saline Take antibiotics Use mucinex DM

## 2013-06-23 ENCOUNTER — Emergency Department: Payer: Self-pay | Admitting: Emergency Medicine

## 2013-06-23 ENCOUNTER — Telehealth: Payer: Self-pay

## 2013-06-23 LAB — BASIC METABOLIC PANEL
Anion Gap: 9 (ref 7–16)
BUN: 7 mg/dL (ref 7–18)
Calcium, Total: 9.6 mg/dL (ref 8.5–10.1)
Chloride: 103 mmol/L (ref 98–107)
Co2: 24 mmol/L (ref 21–32)
Creatinine: 0.56 mg/dL — ABNORMAL LOW (ref 0.60–1.30)
EGFR (African American): >60
EGFR (Non-African Amer.): >60
Glucose: 88 mg/dL (ref 65–99)
Osmolality: 269 (ref 275–301)
Potassium: 3.6 mmol/L (ref 3.5–5.1)
Sodium: 136 mmol/L (ref 136–145)

## 2013-06-23 LAB — CBC
HCT: 39 % (ref 35.0–47.0)
HGB: 12.7 g/dL (ref 12.0–16.0)
MCH: 28.6 pg (ref 26.0–34.0)
MCHC: 32.6 g/dL (ref 32.0–36.0)
MCV: 88 fL (ref 80–100)
Platelet: 248 10*3/uL (ref 150–440)
RBC: 4.44 10*6/uL (ref 3.80–5.20)
RDW: 13.6 % (ref 11.5–14.5)
WBC: 9.6 10*3/uL (ref 3.6–11.0)

## 2013-06-23 LAB — TROPONIN I: Troponin-I: <0.02 ng/mL

## 2013-06-23 NOTE — Telephone Encounter (Signed)
Pt called and states she is having intermittent chest pain, started last night about 1 am, states she is currently still having them.

## 2013-06-23 NOTE — Telephone Encounter (Signed)
Patient started have chest pain at 1 am last night  Rates pain 5/10 when it is at its worst 1/10 when she is relaxing  Pain is worse with exertion and gets better with relaxation  She took aspirin for pain and said it helped "a little"  Pain feels like pressure in her mid chest radiating around her back   Discussed with Dr. Ethelene Hal nurse  She stated patient will have to go to the emergency room because she has not been seen here since 2012   Informed patient that the best course of action is to go to the ED at this time  Also informed that if she felt her situation was non urgent that she could visit Urgent Care  Also advised patient to make a New Patient appt with Dr. Mariah Milling to get reestablished with [place]  Patient verbalized understanding

## 2013-06-28 ENCOUNTER — Encounter (INDEPENDENT_AMBULATORY_CARE_PROVIDER_SITE_OTHER): Payer: Self-pay

## 2013-06-28 ENCOUNTER — Encounter: Payer: Self-pay | Admitting: Cardiovascular Disease

## 2013-06-28 ENCOUNTER — Ambulatory Visit (INDEPENDENT_AMBULATORY_CARE_PROVIDER_SITE_OTHER): Payer: 59 | Admitting: Cardiovascular Disease

## 2013-06-28 VITALS — BP 120/92 | HR 79 | Ht 69.0 in | Wt 145.5 lb

## 2013-06-28 DIAGNOSIS — R079 Chest pain, unspecified: Secondary | ICD-10-CM

## 2013-06-28 DIAGNOSIS — I4949 Other premature depolarization: Secondary | ICD-10-CM

## 2013-06-28 DIAGNOSIS — Q231 Congenital insufficiency of aortic valve: Secondary | ICD-10-CM

## 2013-06-28 DIAGNOSIS — R0789 Other chest pain: Secondary | ICD-10-CM | POA: Insufficient documentation

## 2013-06-28 DIAGNOSIS — I493 Ventricular premature depolarization: Secondary | ICD-10-CM

## 2013-06-28 MED ORDER — NITROGLYCERIN 0.4 MG SL SUBL: 0.4000 mg | SUBLINGUAL_TABLET | SUBLINGUAL | Status: DC | PRN

## 2013-06-28 NOTE — Progress Notes (Signed)
Patient ID: Nancy Welch, female    DOB: Dec 06, 1975, 38 y.o.   MRN: [id number]  HPI Comments: Nancy Welch is a 38 year old woman with history of suspected bicuspid aortic valve, prior echocardiogram 3 years ago, PVCs who presents for followup of chest pain. Smoking history for 7 years, stopped several years ago.  Reports that last week at 2 in the morning, she developed acute onset chest pain in her mediastinal area. Symptoms radiated through to her back. He reports it seemed to come and go like waves lasting through the afternoon. Since then symptoms have been better have been stuttering on and off. Less severe. Typically coming on at rest, not with exertion.  She did go to the emergency room where cardiac enzymes were negative, lab work negative, EKG normal chest x-ray normal Uncertain if there is a positional component of her symptoms. Hurts most in the lower mediastinal area, sometimes worse with palpation. She has not noticed any lower extremity edema, no shortness of breath with exertion  EKG shows normal sinus rhythm with rate 79 beats per minute, no significant ST or T wave changes   Outpatient Encounter Prescriptions as of 06/28/2013  Medication Sig  . ALPRAZolam (XANAX) 0.25 MG tablet Take 0.25 mg by mouth at bedtime as needed.    . Cholecalciferol (VITAMIN D3) 2000 UNITS capsule Take 2,000 Units by mouth daily.    Marland Kitchen desogestrel-ethinyl estradiol (KARIVA) 0.15-0.02/0.01 MG (21/5) per tablet Take 1 tablet by mouth daily.    . Multiple Vitamins-Minerals (CENTRUM SILVER PO) Take 1 tablet by mouth daily.    . sertraline (ZOLOFT) 100 MG tablet Take 50 mg by mouth daily.   . nitroGLYCERIN (NITROSTAT) 0.4 MG SL tablet Place 1 tablet (0.4 mg total) under the tongue every 5 (five) minutes as needed for chest pain.     Review of Systems  Constitutional: Negative.   HENT: Negative.   Eyes: Negative.   Respiratory: Positive for chest tightness.   Cardiovascular: Negative.    Gastrointestinal: Negative.   Endocrine: Negative.   Musculoskeletal: Negative.   Skin: Negative.   Allergic/Immunologic: Negative.   Neurological: Negative.   Hematological: Negative.   Psychiatric/Behavioral: Negative.   All other systems reviewed and are negative.   BP 120/92  Pulse 79  Ht 5\' 9"  (1.753 m)  Wt 145 lb 8 oz (65.998 kg)  BMI 21.48 kg/m2  Physical Exam  Nursing note and vitals reviewed. Constitutional: She is oriented to person, place, and time. She appears well-developed and well-nourished.  HENT:  Head: Normocephalic.  Nose: Nose normal.  Mouth/Throat: Oropharynx is clear and moist.  Eyes: Conjunctivae are normal. Pupils are equal, round, and reactive to light.  Neck: Normal range of motion. Neck supple. No JVD present.  Cardiovascular: Normal rate, regular rhythm, S1 normal, S2 normal, normal heart sounds and intact distal pulses.  Exam reveals no gallop and no friction rub.   No murmur heard. Pulmonary/Chest: Effort normal and breath sounds normal. No respiratory distress. She has no wheezes. She has no rales. She exhibits no tenderness.  Abdominal: Soft. Bowel sounds are normal. She exhibits no distension. There is no tenderness.  Musculoskeletal: Normal range of motion. She exhibits no edema and no tenderness.  Lymphadenopathy:    She has no cervical adenopathy.  Neurological: She is alert and oriented to person, place, and time. Coordination normal.  Skin: Skin is warm and dry. No rash noted. No erythema.  Psychiatric: She has a normal mood and affect. Her behavior is normal. Judgment  and thought content normal.    Assessment and Plan

## 2013-06-28 NOTE — Patient Instructions (Signed)
For chest pain Please try 1/2 nitro as needed with repeat for possible spasm We do have long acting  Nitro pills   Try naproxen one to two as needed for possible imflammation  Start omeprazole one to two a day for acid Ok to supplement with zantac/pepcid and tums  [place] for possible hiatal hernia  Please call us if you have new issues that need to be addressed before your next appt.

## 2013-06-28 NOTE — Assessment & Plan Note (Signed)
She did not report symptomatic PVCs on today's visit

## 2013-06-28 NOTE — Assessment & Plan Note (Signed)
Minimal murmur appreciated on exam today. We'll hold off on any further echocardiogram given benign clinical findings.

## 2013-06-28 NOTE — Assessment & Plan Note (Addendum)
Suspected Atypical chest pain. Hospital records were reviewed including lab work. Unable to rule out esophageal or coronary spasm, pericarditis or other inflammatory issue including costochondritis. Also should consider GERD or other GI pathology. Recommended she take omeprazole for the next month 1 or 2 per day. We'll possible esophageal spasm, she could try sublingual nitroglycerin, cutting the pill in half as needed. If symptoms are more musculoskeletal, could try Aleve/naproxen. If symptoms persist, suggested she call our office. We did offer treadmill study which she reported symptoms were more at rest, not with exertion, less likely ischemia or angina.

## 2013-08-20 ENCOUNTER — Telehealth: Payer: Self-pay

## 2013-08-20 NOTE — Telephone Encounter (Signed)
Spoke w/ pt.  Advised her that after reviewing her chart, Dr. Mariah MillingGollan does not recommend she have prophylactic abx, as she does not have a metal valve.  She verbalizes understanding and will call w/ any questions or concerns.

## 2013-08-20 NOTE — Telephone Encounter (Signed)
Pt called back with a secondary contact # [phone]

## 2013-08-20 NOTE — Telephone Encounter (Signed)
Pt states she is having a tongue piercing tonight, and needs an antibiotic called in. Please call.

## 2013-11-29 ENCOUNTER — Encounter: Payer: Self-pay | Admitting: Cardiovascular Disease

## 2013-12-26 ENCOUNTER — Encounter (HOSPITAL_COMMUNITY): Payer: Self-pay | Admitting: Cardiology

## 2013-12-26 ENCOUNTER — Emergency Department (HOSPITAL_COMMUNITY)
Admission: EM | Admit: 2013-12-26 | Discharge: 2013-12-26 | Disposition: A | Discharge status: Home / Self Care | Payer: 59 | Attending: Emergency Medicine | Admitting: Emergency Medicine

## 2013-12-26 DIAGNOSIS — Z79899 Other long term (current) drug therapy: Secondary | ICD-10-CM | POA: Diagnosis not present

## 2013-12-26 DIAGNOSIS — R000 Tachycardia, unspecified: Secondary | ICD-10-CM | POA: Diagnosis not present

## 2013-12-26 DIAGNOSIS — Z87891 Personal history of nicotine dependence: Secondary | ICD-10-CM | POA: Insufficient documentation

## 2013-12-26 DIAGNOSIS — F329 Major depressive disorder, single episode, unspecified: Secondary | ICD-10-CM | POA: Insufficient documentation

## 2013-12-26 DIAGNOSIS — T781XXA Other adverse food reactions, not elsewhere classified, initial encounter: Secondary | ICD-10-CM | POA: Diagnosis not present

## 2013-12-26 DIAGNOSIS — R0602 Shortness of breath: Secondary | ICD-10-CM | POA: Diagnosis not present

## 2013-12-26 DIAGNOSIS — F410 Panic disorder [episodic paroxysmal anxiety] without agoraphobia: Secondary | ICD-10-CM | POA: Diagnosis not present

## 2013-12-26 DIAGNOSIS — R21 Rash and other nonspecific skin eruption: Secondary | ICD-10-CM | POA: Insufficient documentation

## 2013-12-26 MED ORDER — DIPHENHYDRAMINE HCL 50 MG/ML IJ SOLN
50.0000 mg | Freq: Once | INTRAMUSCULAR | Status: AC
  Administered 2013-12-26: 50 mg via INTRAVENOUS
  Filled 2013-12-26: qty 1

## 2013-12-26 MED ORDER — EPINEPHRINE 0.3 MG/0.3ML IJ SOAJ: 0.3000 mg | Freq: Once | INTRAMUSCULAR | Status: DC

## 2013-12-26 MED ORDER — ALBUTEROL SULFATE (2.5 MG/3ML) 0.083% IN NEBU
5.0000 mg | INHALATION_SOLUTION | Freq: Once | RESPIRATORY_TRACT | Status: AC
  Administered 2013-12-26: 5 mg via RESPIRATORY_TRACT
  Filled 2013-12-26: qty 6

## 2013-12-26 MED ORDER — EPINEPHRINE 0.3 MG/0.3ML IJ SOAJ
INTRAMUSCULAR | Status: AC
  Administered 2013-12-26: 0.3 mg via INTRAMUSCULAR
  Filled 2013-12-26: qty 0.3

## 2013-12-26 MED ORDER — METHYLPREDNISOLONE SODIUM SUCC 125 MG IJ SOLR
125.0000 mg | Freq: Once | INTRAMUSCULAR | Status: AC
  Administered 2013-12-26: 125 mg via INTRAVENOUS
  Filled 2013-12-26: qty 2

## 2013-12-26 NOTE — ED Notes (Signed)
Allergic reaction to ? Pork.  Generalized rash.  Lips swollen.  Pt states she feels like it is hard to breathe.  States she feels like her tongue is swollen.  No visible respiratory distress.  Lungs and upper airway clear when auscultated.  Sat 99% on ra.

## 2013-12-26 NOTE — ED Provider Notes (Signed)
CSN: [id number]     Arrival date & time 12/26/13  0919 History  This chart was scribed for Doug SouSam Andoni Busch, MD by [facility], ED Scribe. The patient was seen in APA01/APA01 and the patient's care was started at 9:28 AM.   Chief Complaint  Patient presents with  . Allergic Reaction   Patient is a 38 y.o. female presenting with allergic reaction. The history is provided by the patient. No language interpreter was used.  Allergic Reaction Presenting symptoms: rash     HPI Comments: Raul Jarold Mottoatterson is a 38 y.o. female who presents to the Emergency Department complaining of an allergic reaction and rash which began a day and a half ago. She has a rash and notes it has spread to her face and mouth. She states her tongue is swelling as well. 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 Pt has tried prednisone she took 6 pills yesterday and 1 today and it has not provided any relief. PT does not smoke, she occasionally drinks but has not for quite some time and does not use illicit drugs. Pt takes Zoloft and BC. Pt is not allergic to any medications. No other associated symptoms   Past Medical History  Diagnosis Date  . PVC's (premature ventricular contractions)   . Anxiety   . SBE (subacute bacterial endocarditis) prophylaxis candidate   . Heart murmur   . 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 Depression   . Panic attacks    Past Surgical History  Procedure Laterality Date  . Ovarian cyst removed      L side   . Dilation and evacuation N/A 12/31/2012    Procedure: DILATATION AND EVACUATION;  Surgeon: Tilda BurrowJohn V Ferguson, MD;  Location: [facility];  Service: Gynecology;  Laterality: N/A;   Family History  Problem Relation Age of Onset  . Hypertension Mother    History  Substance Use Topics  .  Smoking status: Former Games developermoker  . Smokeless tobacco: Not on file     Comment: smoked 1/2 ppd for 5 years; quit in 2005   . Alcohol Use: Yes     Comment: rare   OB History    Gravida Para Term Preterm AB TAB SAB Ectopic Multiple Living   4 1   3  3         Review of Systems  Constitutional: Negative.   HENT: Positive for facial swelling.   Respiratory: Positive for shortness of breath.   Cardiovascular: Negative.   Gastrointestinal: Negative.   Musculoskeletal: Negative.   Skin: Positive for color change and rash.  Allergic/Immunologic: Positive for food allergies.  Neurological: Negative.   Psychiatric/Behavioral: Negative.   All other systems reviewed and are negative.  Allergies  Escitalopram oxalate  Home Medications   Prior to Admission medications   Medication Sig Start Date End Date Taking? Authorizing Provider  ALPRAZolam (XANAX) 0.25 MG tablet Take 0.25 mg by mouth at bedtime as needed.      Historical Provider, MD  Cholecalciferol (VITAMIN D3) 2000 UNITS capsule Take 2,000 Units by mouth daily.      Historical Provider, MD  desogestrel-ethinyl estradiol (KARIVA) 0.15-0.02/0.01 MG (21/5) per tablet Take 1 tablet by mouth daily.      Historical Provider, MD  Multiple Vitamins-Minerals (CENTRUM  SILVER PO) Take 1 tablet by mouth daily.      Historical Provider, MD  nitroGLYCERIN (NITROSTAT) 0.4 MG SL tablet Place 1 tablet (0.4 mg total) under the tongue every 5 (five) minutes as needed for chest pain. 06/28/13   Antonieta Ibaimothy J Gollan, MD  sertraline (ZOLOFT) 100 MG tablet Take 50 mg by mouth daily.  02/22/13   Historical Provider, MD   There were no vitals taken for this visit. Physical Exam  Constitutional: She appears well-developed and well-nourished. No distress.  HENT:  Head: Normocephalic and atraumatic.  No mucosal lesion. No swelling of tongue lips or uvula area and voiced not hoarse  Eyes: Conjunctivae are normal. Pupils are equal, round, and reactive to light.  Neck:  Neck supple. No tracheal deviation present. No thyromegaly present.  Cardiovascular: Normal rate and regular rhythm.   No murmur heard. Pulmonary/Chest: Effort normal. She has no wheezes.  No respiratory distress. End expiratory wheezes  Abdominal: Soft. Bowel sounds are normal. She exhibits no distension. There is no tenderness.  Musculoskeletal: Normal range of motion. She exhibits no edema or tenderness.  Neurological: She is alert. Coordination normal.  Skin: Skin is warm and dry. Rash noted.  Hive-like rash diffusely on face trunk and extremities  Psychiatric: She has a normal mood and affect.  Nursing note and vitals reviewed.  ED Course  Procedures  DIAGNOSTIC STUDIES: Oxygen Saturation is 98% on room air, normal by my interpretation.    COORDINATION OF CARE: 9:33 AM Discussed treatment plan with pt at bedside and pt agreed to plan.   Labs Review Labs Reviewed - No data to display  Imaging Review No results found.   EKG Interpretation None     10:05 AM Reports breathing improved after treatment with intramuscular epinephrine, IV Solu-Medrol and IV Benadryl. Patient speaks in paragraphs.   Rash is essentially unchanged. 1:45 PM rash is much improved. She denies dyspnea. States she is breathing normally. No longer feels swelling in tongue  orlips MDM  Plan prescription EpiPen. Continue prednisone as prescribed. Benadryl 50 mg every 6 hours when necessary itch Avoid pork Diagnosis allergic reaction Final diagnoses:  None       Doug SouSam Cande Mastropietro, MD 12/26/13 1358

## 2013-12-26 NOTE — ED Notes (Addendum)
Pt states she feels like she is breathing better,  States swelling in lips and tongue feels like it is going down.  Rash does not appear to be as red.  Pt ambulatory to bathroom without difficulty.

## 2013-12-26 NOTE — ED Notes (Signed)
Pt states she is starting to feel better.  

## 2013-12-26 NOTE — Discharge Instructions (Signed)
Anaphylactic Reaction If your rash worsens or he developed difficulty breathing or swelling of her tongue lips or throat use the epipen immediately and return here. Finish taking the prednisone prescribed  Earlier. You can take Benadryl 50 mg every 6 hours as needed for itch. Avoid pork as it seems to have caused your allergic reaction An anaphylactic reaction is a sudden, severe allergic reaction that involves the whole body. It can be life threatening. A hospital stay is often required. People with asthma, eczema, or hay fever are slightly more likely to have an anaphylactic reaction. CAUSES  An anaphylactic reaction may be caused by anything to which you are allergic. After being exposed to the allergic substance, your immune system becomes sensitized to it. When you are exposed to that allergic substance again, an allergic reaction can occur. Common causes of an anaphylactic reaction include:  Medicines.  Foods, especially peanuts, wheat, shellfish, milk, and eggs.  Insect bites or stings.  Blood products.  Chemicals, such as dyes, latex, and contrast material used for imaging tests. SYMPTOMS  When an allergic reaction occurs, the body releases histamine and other substances. These substances cause symptoms such as tightening of the airway. Symptoms often develop within seconds or minutes of exposure. Symptoms may include:  Skin rash or hives.  Itching.  Chest tightness.  Swelling of the eyes, tongue, or lips.  Trouble breathing or swallowing.  Lightheadedness or fainting.  Anxiety or confusion.  Stomach pains, vomiting, or diarrhea.  Nasal congestion.  A fast or irregular heartbeat (palpitations). DIAGNOSIS  Diagnosis is based on your history of recent exposure to allergic substances, your symptoms, and a physical exam. Your caregiver may also perform blood or urine tests to confirm the diagnosis. TREATMENT  Epinephrine medicine is the main treatment for an anaphylactic  reaction. Other medicines that may be used for treatment include antihistamines, steroids, and albuterol. In severe cases, fluids and medicine to support blood pressure may be given through an intravenous line (IV). Even if you improve after treatment, you need to be observed to make sure your condition does not get worse. This may require a stay in the hospital. [place] a medical alert bracelet or necklace stating your allergy.  You and your family must learn how to use an anaphylaxis kit or give an epinephrine injection to temporarily treat an emergency allergic reaction. Always carry your epinephrine injection or anaphylaxis kit with you. This can be lifesaving if you have a severe reaction.  Do not drive or perform tasks after treatment until the medicines used to treat your reaction have worn off, or until your caregiver says it is okay.  If you have hives or a rash:  Take medicines as directed by your caregiver.  You may use an over-the-counter antihistamine (diphenhydramine) as needed.  Apply cold compresses to the skin or take baths in cool water. Avoid hot baths or showers. SEEK MEDICAL CARE IF:   You develop symptoms of an allergic reaction to a new substance. Symptoms may start right away or minutes later.  You develop a rash, hives, or itching.  You develop new symptoms. SEEK IMMEDIATE MEDICAL CARE IF:   You have swelling of the mouth, difficulty breathing, or wheezing.  You have a tight feeling in your chest or throat.  You develop hives, swelling, or itching all over your body.  You develop severe vomiting or diarrhea.  You feel faint or pass out. This is an emergency. Use your epinephrine injection or  anaphylaxis kit as you have been instructed. Call your local emergency services (911 in [place] Even if you improve after the injection, you need to be examined at a hospital emergency department. MAKE SURE YOU:   Understand these  instructions.  Will watch your condition.  Will get help right away if you are not doing well or get worse. Document Released: 01/14/2005 Document Revised: 01/19/2013 Document Reviewed: 04/17/2011 [facility] Patient Information 2015 [place], [place]. This information is not intended to replace advice given to you by your health care provider. Make sure you discuss any questions you have with your health care provider.

## 2013-12-26 NOTE — ED Notes (Signed)
Pt resting with eyes shut.  No distress.  

## 2014-03-15 ENCOUNTER — Telehealth: Payer: Self-pay | Admitting: General Practice

## 2014-03-15 NOTE — Telephone Encounter (Signed)
Patient made appointment for cpe in march, since she works for [employer] would like to know if we can fax her something with orders of what she needs so she can get it done for free  [phone] Fax number to [employer] is [phone]

## 2014-03-15 NOTE — Telephone Encounter (Signed)
CBC, CMET, Lipid, Vitamin D

## 2014-03-15 NOTE — Telephone Encounter (Signed)
MD please advise

## 2014-03-17 NOTE — Telephone Encounter (Signed)
Order faxed to [employer]

## 2014-03-29 ENCOUNTER — Emergency Department (HOSPITAL_COMMUNITY)
Admission: EM | Admit: 2014-03-29 | Discharge: 2014-03-29 | Disposition: A | Discharge status: Home / Self Care | Payer: Managed Care, Other (non HMO) | Source: Home / Self Care | Attending: Family Medicine | Admitting: Family Medicine

## 2014-03-29 ENCOUNTER — Encounter (HOSPITAL_COMMUNITY): Payer: Self-pay | Admitting: Emergency Medicine

## 2014-03-29 DIAGNOSIS — R112 Nausea with vomiting, unspecified: Secondary | ICD-10-CM

## 2014-03-29 DIAGNOSIS — R197 Diarrhea, unspecified: Secondary | ICD-10-CM

## 2014-03-29 DIAGNOSIS — R300 Dysuria: Secondary | ICD-10-CM | POA: Diagnosis not present

## 2014-03-29 DIAGNOSIS — R350 Frequency of micturition: Secondary | ICD-10-CM | POA: Diagnosis not present

## 2014-03-29 LAB — POCT URINALYSIS DIP (DEVICE)
Bilirubin Urine: NEGATIVE
Glucose, UA: NEGATIVE mg/dL
Ketones, ur: NEGATIVE mg/dL
Leukocytes, UA: NEGATIVE
Nitrite: NEGATIVE
Protein, ur: NEGATIVE mg/dL
Specific Gravity, Urine: 1.02 (ref 1.005–1.030)
Urobilinogen, UA: 0.2 mg/dL (ref 0.0–1.0)
pH: 6.5 (ref 5.0–8.0)

## 2014-03-29 LAB — POCT PREGNANCY, URINE: Preg Test, Ur: NEGATIVE

## 2014-03-29 MED ORDER — ONDANSETRON 4 MG PO TBDP
4.0000 mg | ORAL_TABLET | Freq: Once | ORAL | Status: AC
  Administered 2014-03-29: 4 mg via ORAL

## 2014-03-29 MED ORDER — ONDANSETRON 4 MG PO TBDP
ORAL_TABLET | ORAL | Status: AC
  Filled 2014-03-29: qty 1

## 2014-03-29 MED ORDER — ONDANSETRON 8 MG PO TBDP: 8.0000 mg | ORAL_TABLET | Freq: Three times a day (TID) | ORAL | Status: DC | PRN

## 2014-03-29 NOTE — ED Provider Notes (Signed)
CSN: [id number]     Arrival date & time 03/29/14  0813 History   First MD Initiated Contact with Patient 03/29/14 [phone]     Chief Complaint  Patient presents with  . Emesis  . Urinary Tract Infection   (Consider location/radiation/quality/duration/timing/severity/associated sxs/prior Treatment) HPI       39 year old female presents complaining of nausea, vomiting, diarrhea, urinary frequency, dysuria. Last week she had a urinary tract infection, this was treated with Bactrim DS twice a day for 3 days. She felt like she was getting better. 5 days ago her daughter contracted viral gastroenteritis. 2 days ago she started to have nausea and vomiting. She had diarrhea yesterday as well. No blood in the diarrhea. This morning she had lower abdominal pain and flank pain as well as urinary frequency and dysuria. She also admits to some mild dizziness. She had a subjective fever yesterday. No leg swelling, no respiratory symptoms.  Past Medical History  Diagnosis Date  . PVC's (premature ventricular contractions)   . Anxiety   . SBE (subacute bacterial endocarditis) prophylaxis candidate   . Heart murmur   . 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 Depression   . Panic attacks    Past Surgical History  Procedure Laterality Date  . Ovarian cyst removed      L side   . Dilation and evacuation N/A 12/31/2012    Procedure: DILATATION AND EVACUATION;  Surgeon: Tilda BurrowJohn V Ferguson, MD;  Location: [facility];  Service: Gynecology;  Laterality: N/A;   Family History  Problem Relation Age of Onset  . Hypertension Mother    History  Substance Use Topics  . Smoking status: Former Games developermoker  . Smokeless tobacco: Not on file     Comment: smoked 1/2 ppd for 5 years; quit in 2005   . Alcohol Use: Yes     Comment: rare   OB History    Gravida Para Term Preterm AB TAB SAB Ectopic Multiple Living   4 1   3  3         Review of Systems   Constitutional: Positive for fever, chills and fatigue.  HENT: Negative for sore throat.   Eyes: Negative for visual disturbance.  Respiratory: Negative for cough and shortness of breath.   Cardiovascular: Negative for chest pain.  Gastrointestinal: Positive for nausea, vomiting, diarrhea and anal bleeding.  Endocrine: Negative for polydipsia.  Genitourinary: Positive for dysuria, urgency and frequency. Negative for vaginal discharge.  Musculoskeletal: Negative for joint swelling.  Skin: Negative for rash.  Neurological: Positive for dizziness and light-headedness.  All other systems reviewed and are negative.   Allergies  Escitalopram oxalate and Pork-derived products  Home Medications   Prior to Admission medications   Medication Sig Start Date End Date Taking? Authorizing Provider  desogestrel-ethinyl estradiol (KARIVA) 0.15-0.02/0.01 MG (21/5) per tablet Take 1 tablet by mouth daily.     Yes Historical Provider, MD  sertraline (ZOLOFT) 100 MG tablet Take 50 mg by mouth daily.  02/22/13  Yes Historical Provider, MD  Cholecalciferol (VITAMIN D3) 2000 UNITS capsule Take 2,000 Units by mouth daily.      Historical Provider, MD  diphenhydrAMINE (BENADRYL) 25 mg capsule Take 50 mg by mouth every 6 (six) hours as needed for itching.    Historical Provider, MD  EPINEPHrine (EPIPEN 2-PAK) 0.3 mg/0.3 mL IJ SOAJ injection Inject 0.3 mLs (0.3 mg total) into the muscle once. 12/26/13  Doug Sou, MD  methylPREDNIsolone (MEDROL DOSPACK) 4 MG tablet Take 1 tablet by mouth as directed. 12/25/13   Historical Provider, MD  Multiple Vitamins-Minerals (CENTRUM SILVER PO) Take 1 tablet by mouth daily.      Historical Provider, MD  nitroGLYCERIN (NITROSTAT) 0.4 MG SL tablet Place 1 tablet (0.4 mg total) under the tongue every 5 (five) minutes as needed for chest pain. 06/28/13   Antonieta Iba, MD  ondansetron (ZOFRAN ODT) 8 MG disintegrating tablet Take 1 tablet (8 mg total) by mouth every 8  (eight) hours as needed for nausea or vomiting. 03/29/14   Graylon Good, PA-C  RANITIDINE HCL PO Take 150 mg by mouth daily.    Historical Provider, MD   BP 116/83 mmHg  Pulse 79  Temp(Src) 97.7 F (36.5 C) (Oral)  Resp 12  SpO2 98%  LMP 02/28/2014 Physical Exam  Constitutional: She is oriented to person, place, and time. Vital signs are normal. She appears well-developed and well-nourished. No distress.  HENT:  Head: Normocephalic and atraumatic.  Mouth/Throat: Oropharynx is clear and moist.  Neck: Normal range of motion. Neck supple. No JVD present.  Cardiovascular: Normal rate, regular rhythm, normal heart sounds and intact distal pulses.  Exam reveals no gallop and no friction rub.   No murmur heard. Pulmonary/Chest: Effort normal and breath sounds normal. No respiratory distress. She has no wheezes. She has no rales.  Abdominal: Soft. Bowel sounds are normal. She exhibits no distension and no mass. There is tenderness (very mild soreness) in the right lower quadrant, suprapubic area and left lower quadrant. There is no rebound and no guarding.  Lymphadenopathy:    She has no cervical adenopathy.  Neurological: She is alert and oriented to person, place, and time. She has normal strength. Coordination normal.  Skin: Skin is warm and dry. No rash noted. She is not diaphoretic.  Psychiatric: She has a normal mood and affect. Judgment normal.  Nursing note and vitals reviewed.   ED Course  Procedures (including critical care time) Labs Review Labs Reviewed  POCT URINALYSIS DIP (DEVICE) - Abnormal; Notable for the following:    Hgb urine dipstick SMALL (*)    All other components within normal limits  URINE CULTURE  CLOSTRIDIUM DIFFICILE BY PCR  POCT PREGNANCY, URINE    Imaging Review No results found.   MDM   1. Nausea vomiting and diarrhea   2. Urinary frequency   3. Dysuria    Mild dizziness is most likely from mild dehydration, I have recommended IV fluids,  however she declined. We will send urine culture. Zofran for nausea. I believe most likely cause is that she has caught the stomach virus from her daughter. Increase fluids call follow-up if worsening. We will call with any positive left results.  Considered ordering C. difficile but stool was formed on her stool sample, no diarrhea.    Meds ordered this encounter  Medications  . ondansetron (ZOFRAN-ODT) disintegrating tablet 4 mg    Sig:   . ondansetron (ZOFRAN ODT) 8 MG disintegrating tablet    Sig: Take 1 tablet (8 mg total) by mouth every 8 (eight) hours as needed for nausea or vomiting.    Dispense:  12 tablet    Refill:  0       Graylon Good, PA-C 03/29/14 0900  Graylon Good, PA-C 03/29/14 1017

## 2014-03-29 NOTE — ED Notes (Signed)
Reports n/v.  Urinary frequency.   States recently treated for bladder infection last week.  Has hx of recurrent kidney infections.    States "I think i got the stomach bug from my daughter".    No otc meds taken for treatment.

## 2014-03-29 NOTE — Discharge Instructions (Signed)
The most likely cause of her symptoms is that you have caught the stomach virus from your daughter. Increase fluids, take Zofran as needed for nausea. We have sent a urine culture as well as a C. difficile test to rule out any more serious infections. We will call you with any positive results. Come back if you're not getting any better in a few days  Clostridium Difficile Toxin This is a test which may be done when a patient has diarrhea that lasts for several days, or has abdominal pain, fever, and nausea after antibiotic therapy.  This test looks for the presence of Clostridium difficile (C.diff.) toxin in a stool sample. C.diff. is a germ (bacterium) that is one of the groups of bacteria that are usually in the colon, called "normal flora." If something upsets the growth of the other normal flora, C.diff. may overgrow and disrupt the balance of bacteria in the colon. C. diff. may produce two toxins, A and B. The combination of overgrowth and toxins may cause prolonged diarrhea. The toxins may damage the lining of the colon and lead to colitis.  While some cases of C. diff. diarrhea and colitis do not require treatment, others require specific oral antibiotic therapy. Most patients improve as the normal flora re-establishes itself, but about some may have one or more relapses, with symptoms and detectible toxin levels coming back. PREPARATION FOR TEST There is no special preparation for the test. A fresh stool sample is collected in a sterile container. The sample should not be mixed with urine or water. The stool should be taken to the lab within an hour. It may be refrigerated or frozen and taken to the lab as soon as possible. The container should be labeled with your name and the date and time of the stool collection.  NORMAL FINDINGS Negative Tissue Culture (no toxin identified) Ranges for normal findings may vary among different laboratories and hospitals. You should always check with your doctor  after having lab work or other tests done to discuss the meaning of your test results and whether your values are considered within normal limits. MEANING OF TEST  Your caregiver will go over the test results with you and discuss the importance and meaning of your results, as well as treatment options and the need for additional tests if necessary. OBTAINING THE TEST RESULTS It is your responsibility to obtain your test results. Ask the lab or department performing the test when and how you will get your results. Document Released: 02/07/2004 Document Revised: 04/08/2011 Document Reviewed: 12/24/2007 [facility] Patient Information 2015 [place]. This information is not intended to replace advice given to you by your health care provider. Make sure you discuss any questions you have with your health care provider.  Food Choices to Help Relieve Diarrhea When you have diarrhea, the foods you eat and your eating habits are very important. Choosing the right foods and drinks can help relieve diarrhea. Also, because diarrhea can last up to 7 days, you need to replace lost fluids and electrolytes (such as sodium, potassium, and chloride) in order to help prevent dehydration.  WHAT GENERAL GUIDELINES DO I NEED TO FOLLOW?  Slowly drink 1 cup (8 oz) of fluid for each episode of diarrhea. If you are getting enough fluid, your urine will be clear or pale yellow.  Eat starchy foods. Some good choices include white rice, white toast, pasta, low-fiber cereal, baked potatoes (without the skin), saltine crackers, and bagels.  Avoid large servings of any  cooked vegetables.  Limit fruit to two servings per day. A serving is  cup or 1 small piece.  Choose foods with less than 2 g of fiber per serving.  Limit fats to less than 8 tsp (38 g) per day.  Avoid fried foods.  Eat foods that have probiotics in them. Probiotics can be found in certain dairy products.  Avoid foods and beverages that may increase  the speed at which food moves through the stomach and intestines (gastrointestinal tract). Things to avoid include:  High-fiber foods, such as dried fruit, raw fruits and vegetables, nuts, seeds, and whole grain foods.  Spicy foods and high-fat foods.  Foods and beverages sweetened with high-fructose corn syrup, honey, or sugar alcohols such as xylitol, sorbitol, and mannitol. WHAT FOODS ARE RECOMMENDED? Grains White rice. White, [place], or pita breads (fresh or toasted), including plain rolls, buns, or bagels. White pasta. Saltine, soda, or graham crackers. Pretzels. Low-fiber cereal. Cooked cereals made with water (such as cornmeal, farina, or cream cereals). Plain muffins. Matzo. Melba toast. Zwieback.  Vegetables Potatoes (without the skin). Strained tomato and vegetable juices. Most well-cooked and canned vegetables without seeds. Tender lettuce. Fruits Cooked or canned applesauce, apricots, cherries, fruit cocktail, grapefruit, peaches, pears, or plums. Fresh bananas, apples without skin, cherries, grapes, cantaloupe, grapefruit, peaches, oranges, or plums.  Meat and Other Protein Products Baked or boiled chicken. Eggs. Tofu. Fish. Seafood. Smooth peanut butter. Ground or well-cooked tender beef, ham, veal, lamb, pork, or poultry.  Dairy Plain yogurt, kefir, and unsweetened liquid yogurt. Lactose-free milk, buttermilk, or soy milk. Plain hard cheese. Beverages Sport drinks. Clear broths. Diluted fruit juices (except prune). Regular, caffeine-free sodas such as ginger ale. Water. Decaffeinated teas. Oral rehydration solutions. Sugar-free beverages not sweetened with sugar alcohols. Other Bouillon, broth, or soups made from recommended foods.  The items listed above may not be a complete list of recommended foods or beverages. Contact your dietitian for more options. WHAT FOODS ARE NOT RECOMMENDED? Grains Whole grain, whole wheat, bran, or rye breads, rolls, pastas, crackers, and  cereals. Wild or brown rice. Cereals that contain more than 2 g of fiber per serving. Corn tortillas or taco shells. Cooked or dry oatmeal. Granola. Popcorn. Vegetables Raw vegetables. Cabbage, broccoli, Brussels sprouts, artichokes, baked beans, beet greens, corn, kale, legumes, peas, sweet potatoes, and yams. Potato skins. Cooked spinach and cabbage. Fruits Dried fruit, including raisins and dates. Raw fruits. Stewed or dried prunes. Fresh apples with skin, apricots, mangoes, pears, raspberries, and strawberries.  Meat and Other Protein Products Chunky peanut butter. Nuts and seeds. Beans and lentils. Tomasa Blase.  Dairy High-fat cheeses. Milk, chocolate milk, and beverages made with milk, such as milk shakes. Cream. Ice cream. Sweets and Desserts Sweet rolls, doughnuts, and sweet breads. Pancakes and waffles. Fats and Oils Butter. Cream sauces. Margarine. Salad oils. Plain salad dressings. Olives. Avocados.  Beverages Caffeinated beverages (such as coffee, tea, soda, or energy drinks). Alcoholic beverages. Fruit juices with pulp. Prune juice. Soft drinks sweetened with high-fructose corn syrup or sugar alcohols. Other Coconut. Hot sauce. Chili powder. Mayonnaise. Gravy. Cream-based or milk-based soups.  The items listed above may not be a complete list of foods and beverages to avoid. Contact your dietitian for more information. WHAT SHOULD I DO IF I BECOME DEHYDRATED? Diarrhea can sometimes lead to dehydration. Signs of dehydration include dark urine and dry mouth and skin. If you think you are dehydrated, you should rehydrate with an oral rehydration solution. These solutions can be purchased at  pharmacies, retail stores, or online.  Drink -1 cup (120-240 mL) of oral rehydration solution each time you have an episode of diarrhea. If drinking this amount makes your diarrhea worse, try drinking smaller amounts more often. For example, drink 1-3 tsp (5-15 mL) every 5-10 minutes.  A general rule for  staying hydrated is to drink 1-2 L of fluid per day. Talk to your health care provider about the specific amount you should be drinking each day. Drink enough fluids to keep your urine clear or pale yellow. Document Released: 04/06/2003 Document Revised: 01/19/2013 Document Reviewed: 12/07/2012 [facility] Patient Information 2015 [place], [place]. This information is not intended to replace advice given to you by your health care provider. Make sure you discuss any questions you have with your health care provider.  Nausea and Vomiting Nausea is a sick feeling that often comes before throwing up (vomiting). Vomiting is a reflex where stomach contents come out of your mouth. Vomiting can cause severe loss of body fluids (dehydration). Children and elderly adults can become dehydrated quickly, especially if they also have diarrhea. Nausea and vomiting are symptoms of a condition or disease. It is important to find the cause of your symptoms. CAUSES   Direct irritation of the stomach lining. This irritation can result from increased acid production (gastroesophageal reflux disease), infection, food poisoning, taking certain medicines (such as nonsteroidal anti-inflammatory drugs), alcohol use, or tobacco use.  Signals from the brain.These signals could be caused by a headache, heat exposure, an inner ear disturbance, increased pressure in the brain from injury, infection, a tumor, or a concussion, pain, emotional stimulus, or metabolic problems.  An obstruction in the gastrointestinal tract (bowel obstruction).  Illnesses such as diabetes, hepatitis, gallbladder problems, appendicitis, kidney problems, cancer, sepsis, atypical symptoms of a heart attack, or eating disorders.  Medical treatments such as chemotherapy and radiation.  Receiving medicine that makes you sleep (general anesthetic) during surgery. DIAGNOSIS Your caregiver may ask for tests to be done if the problems do not improve after a  few days. Tests may also be done if symptoms are severe or if the reason for the nausea and vomiting is not clear. Tests may include:  Urine tests.  Blood tests.  Stool tests.  Cultures (to look for evidence of infection).  X-rays or other imaging studies. Test results can help your caregiver make decisions about treatment or the need for additional tests. TREATMENT You need to stay well hydrated. Drink frequently but in small amounts.You may wish to drink water, sports drinks, clear broth, or eat frozen ice pops or gelatin dessert to help stay hydrated.When you eat, eating slowly may help prevent nausea.There are also some antinausea medicines that may help prevent nausea. HOME CARE INSTRUCTIONS   Take all medicine as directed by your caregiver.  If you do not have an appetite, do not force yourself to eat. However, you must continue to drink fluids.  If you have an appetite, eat a normal diet unless your caregiver tells you differently.  Eat a variety of complex carbohydrates (rice, wheat, potatoes, bread), lean meats, yogurt, fruits, and vegetables.  Avoid high-fat foods because they are more difficult to digest.  Drink enough water and fluids to keep your urine clear or pale yellow.  If you are dehydrated, ask your caregiver for specific rehydration instructions. Signs of dehydration may include:  Severe thirst.  Dry lips and mouth.  Dizziness.  Dark urine.  Decreasing urine frequency and amount.  Confusion.  Rapid breathing or pulse.  SEEK IMMEDIATE MEDICAL CARE IF:   You have blood or brown flecks (like coffee grounds) in your vomit.  You have black or bloody stools.  You have a severe headache or stiff neck.  You are confused.  You have severe abdominal pain.  You have chest pain or trouble breathing.  You do not urinate at least once every 8 hours.  You develop cold or clammy skin.  You continue to vomit for longer than 24 to 48 hours.  You  have a fever. MAKE SURE YOU:   Understand these instructions.  Will watch your condition.  Will get help right away if you are not doing well or get worse. Document Released: 01/14/2005 Document Revised: 04/08/2011 Document Reviewed: 06/13/2010 [facility] Patient Information 2015 [place], [place]. This information is not intended to replace advice given to you by your health care provider. Make sure you discuss any questions you have with your health care provider.

## 2014-03-30 LAB — URINE CULTURE
Colony Count: NO GROWTH
Culture: NO GROWTH

## 2014-04-05 ENCOUNTER — Telehealth (HOSPITAL_COMMUNITY): Payer: Self-pay | Admitting: *Deleted

## 2014-04-05 NOTE — ED Notes (Signed)
Pt. called and asked for her lab result.  Pt. verified x 2 and given neg. results. ( Urine culture: No growth).  Pt. said she is still having symptoms.  Pt. instructed to get rechecked by her PCP.  She said she does not have one but is planning to go to another Urgent Care.

## 2014-04-19 ENCOUNTER — Other Ambulatory Visit: Payer: Self-pay | Admitting: Family Medicine

## 2014-04-19 ENCOUNTER — Encounter: Payer: Self-pay | Admitting: Family Medicine

## 2014-04-19 ENCOUNTER — Ambulatory Visit (INDEPENDENT_AMBULATORY_CARE_PROVIDER_SITE_OTHER): Payer: Managed Care, Other (non HMO) | Admitting: Family Medicine

## 2014-04-19 VITALS — BP 126/84 | HR 72 | Temp 98.3°F | Resp 16 | Ht 69.0 in | Wt 145.0 lb

## 2014-04-19 DIAGNOSIS — Z124 Encounter for screening for malignant neoplasm of cervix: Secondary | ICD-10-CM

## 2014-04-19 DIAGNOSIS — Z0000 Encounter for general adult medical examination without abnormal findings: Secondary | ICD-10-CM

## 2014-04-19 DIAGNOSIS — J0100 Acute maxillary sinusitis, unspecified: Secondary | ICD-10-CM

## 2014-04-19 DIAGNOSIS — N760 Acute vaginitis: Secondary | ICD-10-CM | POA: Diagnosis not present

## 2014-04-19 DIAGNOSIS — N3000 Acute cystitis without hematuria: Secondary | ICD-10-CM | POA: Diagnosis not present

## 2014-04-19 LAB — URINALYSIS, MICROSCOPIC ONLY
Casts: NONE SEEN
Crystals: NONE SEEN
RBC / HPF: NONE SEEN RBC/hpf (ref <3)

## 2014-04-19 LAB — URINALYSIS, ROUTINE W REFLEX MICROSCOPIC
Bilirubin Urine: NEGATIVE
Glucose, UA: NEGATIVE mg/dL
Hgb urine dipstick: NEGATIVE
Ketones, ur: NEGATIVE mg/dL
Nitrite: NEGATIVE
Protein, ur: NEGATIVE mg/dL
Specific Gravity, Urine: 1.015 (ref 1.005–1.030)
Urobilinogen, UA: 0.2 mg/dL (ref 0.0–1.0)
pH: 7.5 (ref 5.0–8.0)

## 2014-04-19 LAB — WET PREP FOR TRICH, YEAST, CLUE
Clue Cells Wet Prep HPF POC: NONE SEEN
Trich, Wet Prep: NONE SEEN
Yeast Wet Prep HPF POC: NONE SEEN

## 2014-04-19 MED ORDER — SULFAMETHOXAZOLE-TRIMETHOPRIM 400-80 MG PO TABS: 1.0000 | ORAL_TABLET | Freq: Two times a day (BID) | ORAL | Status: DC

## 2014-04-19 MED ORDER — FLUCONAZOLE 150 MG PO TABS: 150.0000 mg | ORAL_TABLET | Freq: Once | ORAL | Status: DC

## 2014-04-19 NOTE — Patient Instructions (Addendum)
I recommend eye visit once a year I recommend dental visit every 6 months Goal is to  Exercise 30 minutes 5 days a week We will send a letter with lab results  Release of records- [facility] Fax me a copy of your work labs [phone] You can stop the zoloft over a weekend to minimize any side effects Take antibiotics as prescribed, yeast pill at end of antibiotics F/U as needed

## 2014-04-19 NOTE — Progress Notes (Addendum)
Patient ID: Nancy Welch, female   DOB: April 10, 1975, 40 y.o.   MRN: [id number]   Subjective:    Patient ID: Nancy Welch, female    DOB: 13-Feb-1975, 39 y.o.   MRN: [id number]  Patient presents for CPE with PAP and Illness  patient here for complete physical exam. She is due for Pap smear. She has history of abnormal Pap seems like she has had a weak procedure done. She was recently married this weekend and has had a lot of vaginal irritation mild discharge and some mild dysuria. She states she is prone to getting UTIs after sexual intercourse. She is on birth control.  She complains of sinus pressure and drainage low-grade fever mild sore throat minimal cough for the past 3-4 days. She is taking over-the-counter decongestants and a Vicks Inhaler.   On review of medications she is currently on Zoloft but she has tapered herself down where she takes a third of a 100 mg tablet daily. She will succumb off of the medication. She denies any symptoms of depression or anxiety at this time. This was last prescribed by her GYN she's been tapering off for months     Review Of Systems:  GEN- denies fatigue, +fever, weight loss,weakness, recent illness HEENT- denies eye drainage, change in vision, +nasal discharge, CVS- denies chest pain, palpitations RESP- denies SOB, cough, wheeze ABD- denies N/V, change in stools, abd pain GU- +dysuria, hematuria, dribbling, incontinence MSK- denies joint pain, muscle aches, injury Neuro- denies headache, dizziness, syncope, seizure activity       Objective:    BP 126/84 mmHg  Pulse 72  Temp(Src) 98.3 F (36.8 C) (Oral)  Resp 16  Ht  (1.753 m)  Wt 145 lb (65.772 kg)  BMI 21.40 kg/m2  LMP 04/12/2014 (Approximate) GEN- NAD, alert and oriented x3 HEENT- PERRL, EOMI, non injected sclera, pink conjunctiva, MMM, oropharynx mild injection, nares + rhinorrhea,turbinates enlarged, +maxillary sinus tenderness, TM clear bilat no effusion Neck- Supple,  no thyromegaly, shotty ant LAD CVS- RRR, 2/6 SEM RESP-CTAB Breast- normal symmetry, no nipple inversion,no nipple drainage, no nodules or lumps felt Nodes- no axillary nodes ABD-NABS,soft,NT,ND GU- normal external genitalia, vaginal mucosa pink and moist, cervix visualized no growth, no blood form os, minimal thin clear discharge, no CMT, no ovarian masses, uterus normal size Psych- normal affect and mood EXT- No edema Pulses- Radial, DP- 2+        Assessment & Plan:      Problem List Items Addressed This Visit    None    Visit Diagnoses    Routine general medical examination at a health care facility    -  Primary    PAP smear done, pt to bring copy of fasting labs from work    Cervical cancer screening        Relevant Orders    PAP, ThinPrep ASCUS Rflx HPV Rflx Type    Vaginitis and vulvovaginitis        Relevant Orders    Urinalysis, Routine w reflex microscopic (Completed)    WET PREP FOR TRICH, YEAST, CLUE (Completed)    Acute maxillary sinusitis, recurrence not specified        Treat with bactrim as she has UTI as well, discussed OCP use with antibiotics, nasal saline, decongest    Relevant Medications    BACTRIM 400-80 MG PO TABS    fluconazole (DIFLUCAN) tablet 150 mg    Acute cystitis without hematuria        Bactrim per  above       Note: This dictation was prepared with Dragon dictation along with smaller phrase technology. Any transcriptional errors that result from this process are unintentional.

## 2014-04-20 LAB — PAP THINPREP ASCUS RFLX HPV RFLX TYPE

## 2014-04-22 LAB — HPV MRNA, HIGH RISK, RFLX 16,18/45: HPV mRNA, High Risk: DETECTED — AB

## 2014-04-25 ENCOUNTER — Other Ambulatory Visit: Payer: Self-pay | Admitting: *Deleted

## 2014-04-25 DIAGNOSIS — R87612 Low grade squamous intraepithelial lesion on cytologic smear of cervix (LGSIL): Secondary | ICD-10-CM

## 2014-04-25 DIAGNOSIS — IMO0002 Reserved for concepts with insufficient information to code with codable children: Secondary | ICD-10-CM

## 2014-04-26 LAB — HPV TYPE 16 AND 18/45 RNA
HPV Type 16 RNA: DETECTED — AB
HPV Type 18/45 RNA: NOT DETECTED

## 2014-06-09 ENCOUNTER — Ambulatory Visit (INDEPENDENT_AMBULATORY_CARE_PROVIDER_SITE_OTHER): Payer: Managed Care, Other (non HMO) | Admitting: Physician Assistant

## 2014-06-09 ENCOUNTER — Encounter: Payer: Self-pay | Admitting: Physician Assistant

## 2014-06-09 VITALS — BP 120/80 | HR 66 | Temp 98.0°F | Resp 18 | Wt 130.0 lb

## 2014-06-09 DIAGNOSIS — M542 Cervicalgia: Secondary | ICD-10-CM

## 2014-06-09 MED ORDER — HYDROCODONE-ACETAMINOPHEN 5-325 MG PO TABS: 1.0000 | ORAL_TABLET | Freq: Four times a day (QID) | ORAL | Status: DC | PRN

## 2014-06-09 MED ORDER — CYCLOBENZAPRINE HCL 10 MG PO TABS: 10.0000 mg | ORAL_TABLET | Freq: Three times a day (TID) | ORAL | Status: DC | PRN

## 2014-06-09 MED ORDER — DICLOFENAC SODIUM 75 MG PO TBEC: 75.0000 mg | DELAYED_RELEASE_TABLET | Freq: Two times a day (BID) | ORAL | Status: DC
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
## 2014-06-13 ENCOUNTER — Telehealth: Payer: Self-pay | Admitting: Family Medicine

## 2014-06-13 NOTE — Telephone Encounter (Signed)
[phone] Clydie BraunKaren from [facility] has called and left a VM to make you aware of the apt that has been scheduled for the pt it is for May 25 at 10:45 with Dr Bing QuarrySolo at the [place] office.

## 2014-06-14 NOTE — Telephone Encounter (Signed)
noted 

## 2014-10-22 LAB — OB RESULTS CONSOLE RPR: RPR: NONREACTIVE

## 2014-10-22 LAB — OB RESULTS CONSOLE HEPATITIS B SURFACE ANTIGEN: Hepatitis B Surface Ag: NEGATIVE

## 2014-10-22 LAB — OB RESULTS CONSOLE HIV ANTIBODY (ROUTINE TESTING): HIV: NONREACTIVE

## 2014-10-22 LAB — OB RESULTS CONSOLE VARICELLA ZOSTER ANTIBODY, IGG: Varicella: IMMUNE

## 2014-10-22 LAB — OB RESULTS CONSOLE RUBELLA ANTIBODY, IGM: Rubella: IMMUNE

## 2015-01-29 NOTE — L&D Delivery Note (Signed)
Obstetrical Delivery Note   Date of Delivery:   06/12/2015 Primary OB:   [facility] Gestational Age/EDD: 3927w2d (Dated by 8 wk ultrasound) Antepartum complications: AMA, gestational hypertension, Cat 2 tracing  Delivered By:   Farrel ConnersGUTIERREZ, Zoe Creasman, CNM  Delivery Type:   spontaneous vaginal delivery  Procedure Details:   Patient pushed well delivering a vigorous female infant over an intact perineum. Baby with good cry. Shoulder cord over posterior shoulder reduced with delivery. Baby placed on mother. Delayed cord clamping for 45 sec, then cord cut by FOB. Venous cord gases and cord blood obtained obtained from placenta after collecting cord blood for cord blood banking. Placenta delivered intact with 3 vessel cord. Fundus firm after delivery of placenta. Anesthesia:    epidural Intrapartum complications: meconium stained amniotic fluid, variable decelerations, intermittent Cat 2 tracing, shoulder cord GBS:    Negative Laceration:    Periurethral, left repaired with 3-0 Chromic Episiotomy:    none Placenta:    Via active 3rd stage. To pathology: no Estimated Blood Loss:  400 ml  Baby:    Liveborn female, Apgars 9/9, weight 7#10 oz    Rayanne Padmanabhan, CNM

## 2015-03-24 LAB — OB RESULTS CONSOLE RPR: RPR: NONREACTIVE

## 2015-03-24 LAB — OB RESULTS CONSOLE HIV ANTIBODY (ROUTINE TESTING): HIV: NONREACTIVE

## 2015-05-27 LAB — OB RESULTS CONSOLE GBS: GBS: NEGATIVE

## 2015-06-11 ENCOUNTER — Inpatient Hospital Stay
Admission: EM | Admit: 2015-06-11 | Discharge: 2015-06-14 | DRG: 774 | Disposition: A | Discharge status: Home / Self Care | Payer: Managed Care, Other (non HMO) | Attending: Certified Nurse Midwife | Admitting: Certified Nurse Midwife

## 2015-06-11 ENCOUNTER — Encounter: Payer: Self-pay | Admitting: *Deleted

## 2015-06-11 DIAGNOSIS — O99413 Diseases of the circulatory system complicating pregnancy, third trimester: Secondary | ICD-10-CM | POA: Diagnosis present

## 2015-06-11 DIAGNOSIS — Z87891 Personal history of nicotine dependence: Secondary | ICD-10-CM | POA: Diagnosis not present

## 2015-06-11 DIAGNOSIS — Z3A40 40 weeks gestation of pregnancy: Secondary | ICD-10-CM | POA: Diagnosis not present

## 2015-06-11 DIAGNOSIS — O480 Post-term pregnancy: Secondary | ICD-10-CM | POA: Diagnosis present

## 2015-06-11 DIAGNOSIS — I358 Other nonrheumatic aortic valve disorders: Secondary | ICD-10-CM | POA: Diagnosis present

## 2015-06-11 DIAGNOSIS — O133 Gestational [pregnancy-induced] hypertension without significant proteinuria, third trimester: Secondary | ICD-10-CM | POA: Diagnosis present

## 2015-06-11 DIAGNOSIS — Z8249 Family history of ischemic heart disease and other diseases of the circulatory system: Secondary | ICD-10-CM | POA: Diagnosis not present

## 2015-06-11 DIAGNOSIS — O134 Gestational [pregnancy-induced] hypertension without significant proteinuria, complicating childbirth: Secondary | ICD-10-CM | POA: Diagnosis present

## 2015-06-11 LAB — COMPREHENSIVE METABOLIC PANEL
ALT: 13 U/L — ABNORMAL LOW (ref 14–54)
AST: 21 U/L (ref 15–41)
Albumin: 3.1 g/dL — ABNORMAL LOW (ref 3.5–5.0)
Alkaline Phosphatase: 103 U/L (ref 38–126)
Anion gap: 9 (ref 5–15)
BUN: 8 mg/dL (ref 6–20)
CO2: 23 mmol/L (ref 22–32)
Calcium: 8.9 mg/dL (ref 8.9–10.3)
Chloride: 104 mmol/L (ref 101–111)
Creatinine, Ser: 0.47 mg/dL (ref 0.44–1.00)
GFR calc Af Amer: >60 mL/min (ref >60)
GFR calc non Af Amer: >60 mL/min (ref >60)
Glucose, Bld: 93 mg/dL (ref 65–99)
Potassium: 3.5 mmol/L (ref 3.5–5.1)
Sodium: 136 mmol/L (ref 135–145)
Total Bilirubin: 0.9 mg/dL (ref 0.3–1.2)
Total Protein: 6.6 g/dL (ref 6.5–8.1)

## 2015-06-11 LAB — ABO/RH: ABO/RH(D): O POS

## 2015-06-11 LAB — PROTEIN / CREATININE RATIO, URINE
Creatinine, Urine: 19 mg/dL
Total Protein, Urine: <6 mg/dL

## 2015-06-11 LAB — CBC
HCT: 36.9 % (ref 35.0–47.0)
Hemoglobin: 12.2 g/dL (ref 12.0–16.0)
MCH: 29.1 pg (ref 26.0–34.0)
MCHC: 33.1 g/dL (ref 32.0–36.0)
MCV: 87.7 fL (ref 80.0–100.0)
Platelets: 196 10*3/uL (ref 150–440)
RBC: 4.21 MIL/uL (ref 3.80–5.20)
RDW: 13.8 % (ref 11.5–14.5)
WBC: 9.4 10*3/uL (ref 3.6–11.0)

## 2015-06-11 LAB — TYPE AND SCREEN
ABO/RH(D): O POS
Antibody Screen: NEGATIVE

## 2015-06-11 MED ORDER — ONDANSETRON HCL 4 MG/2ML IJ SOLN
4.0000 mg | Freq: Four times a day (QID) | INTRAMUSCULAR | Status: DC | PRN
  Administered 2015-06-12: 4 mg via INTRAVENOUS
  Filled 2015-06-11: qty 2

## 2015-06-11 MED ORDER — DINOPROSTONE 10 MG VA INST
10.0000 mg | VAGINAL_INSERT | Freq: Once | VAGINAL | Status: AC
  Administered 2015-06-11: 10 mg via VAGINAL
  Filled 2015-06-11: qty 1

## 2015-06-11 MED ORDER — CITRIC ACID-SODIUM CITRATE 334-500 MG/5ML PO SOLN: 30.0000 mL | ORAL | Status: DC | PRN

## 2015-06-11 MED ORDER — FENTANYL CITRATE (PF) 100 MCG/2ML IJ SOLN
25.0000 ug | Freq: Once | INTRAMUSCULAR | Status: AC
Start: 2015-06-11 — End: 2015-06-11
  Administered 2015-06-11: 25 ug via INTRAVENOUS
  Filled 2015-06-11: qty 2

## 2015-06-11 MED ORDER — LIDOCAINE HCL (PF) 1 % IJ SOLN
30.0000 mL | INTRAMUSCULAR | Status: DC | PRN
  Filled 2015-06-11: qty 30

## 2015-06-11 MED ORDER — TERBUTALINE SULFATE 1 MG/ML IJ SOLN: 0.2500 mg | Freq: Once | INTRAMUSCULAR | Status: DC | PRN

## 2015-06-11 MED ORDER — LACTATED RINGERS IV SOLN
500.0000 mL | INTRAVENOUS | Status: DC | PRN
  Administered 2015-06-11: 500 mL via INTRAVENOUS
  Administered 2015-06-12: 300 mL via INTRAVENOUS
  Administered 2015-06-12 (×2): 500 mL via INTRAVENOUS

## 2015-06-11 MED ORDER — LACTATED RINGERS IV SOLN
INTRAVENOUS | Status: DC
  Administered 2015-06-11 (×2): via INTRAVENOUS
  Administered 2015-06-11 – 2015-06-12 (×2): 125 mL/h via INTRAVENOUS

## 2015-06-11 MED ORDER — SERTRALINE HCL 50 MG PO TABS: 50.0000 mg | ORAL_TABLET | Freq: Every day | ORAL | Status: DC

## 2015-06-11 MED ORDER — OXYTOCIN 40 UNITS IN LACTATED RINGERS INFUSION - SIMPLE MED
2.5000 [IU]/h | INTRAVENOUS | Status: DC
  Filled 2015-06-11: qty 1000

## 2015-06-11 MED ORDER — SERTRALINE HCL 50 MG PO TABS
50.0000 mg | ORAL_TABLET | Freq: Every day | ORAL | Status: DC
  Administered 2015-06-11 – 2015-06-13 (×3): 50 mg via ORAL
  Filled 2015-06-11 (×3): qty 1

## 2015-06-11 MED ORDER — OXYTOCIN BOLUS FROM INFUSION
500.0000 mL | INTRAVENOUS | Status: DC
  Administered 2015-06-12: 500 mL via INTRAVENOUS

## 2015-06-11 MED ORDER — OXYTOCIN 10 UNIT/ML IJ SOLN
10.0000 [IU] | Freq: Once | INTRAMUSCULAR | Status: DC
  Filled 2015-06-11: qty 2

## 2015-06-11 NOTE — Progress Notes (Signed)
2 late decelerations noted. Dr Jean Rosenthaljackson made aware.

## 2015-06-11 NOTE — Progress Notes (Signed)
Patient ID: Nancy Welch, female   DOB: [birth year], 40 y.o.   MRN: [id number] Labor Check  Subj:  Complaints: occasional cramping   Obj:  BP 119/67 mmHg  Pulse 94  Temp(Src) 98.2 F (36.8 C) (Oral)  Resp 18  Ht 5\' 9"  (1.753 m)  Wt 80.74 kg (178 lb)  BMI 26.27 kg/m2  LMP 09/03/2014    Cervix: Dilation: 1 / Effacement (%): 30 / Station: -3  Baseline FHR: 145    Variability: moderate    Accelerations: present    Decelerations: occasional late decelerations (every few hours) shallow and short-duration, with quick resolution. When she goes to the bathroom and returns and is placed on fetal monitoring again there appears to be a possible deeper deceleration vs maternal heart tracing that is ~1 minute, but returns to baseline.  The vast majority of the time there are no decelerations. Contractions: present frequency: 2-3 q 10 min  A/P: 40 y.o. [id number] female at 6834w1d with AMA, Gestational hypertension.  1.  Labor: Cervidil placed to begin ripening process.  Patient understands that if fetus shows signs of distress, we will move to cesarean delivery.  2.  FWB: reassuring overall, Overall assessment: category 1  3.  GBS negative  4.  Pain: prn 5.  Recheck: prn   Nancy MohairStephen Melenie Minniear, MD 06/11/2015 11:32 AM

## 2015-06-11 NOTE — OB Triage Note (Signed)
Patient states she has been leaking fluid since 2200, clear. Pt states she has been having irregular contractions and baby has been moving. Denies any other problems

## 2015-06-11 NOTE — Plan of Care (Signed)
Dr Mikey Bussingvan stavern in to talk with pt regarding epidural. Afterwards, pt became anxious and felt like she was having some pvc's. ekg monitor and monitor did show occasional pvc's. Pt reassured. Order received from dr Jean Rosenthaljackson that pt could receive a small dose of fentanyl to help pt rest. Will start on very low dose of pitocin shortly

## 2015-06-11 NOTE — Progress Notes (Signed)
Cervidil fell out while pt up to BR. Cervix checked. Dr Jean Rosenthaljackson notified. Will await further orders. Pt up on birthing ball with portable monitors

## 2015-06-11 NOTE — H&P (Signed)
OB History & Physical   History of Present Illness:  Chief Complaint: gush of fluid  HPI:  Nancy Welch is a 40 y.o. [id number] female at 3437w1d dated by LMP c/w 8 week u/s.  Her pregnancy has been complicated by Advanced maternal age 40(40 yo), history of severe postpartum depression.    She reports contractions since arrival to the L&D unit.   She reports leakage of fluid at 11pm prompting her visit to L&D.   She denies vaginal bleeding.   She reports fetal movement.    Maternal Medical History:   Past Medical History  Diagnosis Date  . PVC's (premature ventricular contractions)   . Anxiety   . SBE (subacute bacterial endocarditis) prophylaxis candidate   . Heart murmur   . 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 Depression   . Panic attacks     Past Surgical History  Procedure Laterality Date  . Ovarian cyst removed      L side   . Dilation and evacuation N/A 12/31/2012    Procedure: DILATATION AND EVACUATION;  Surgeon: Tilda BurrowJohn V Ferguson, MD;  Location: [facility];  Service: Gynecology;  Laterality: N/A;    Allergies  Allergen Reactions  . Escitalopram Oxalate Nausea Only    Dizziness/ vertigo.   . Pork-Derived Products     Anaphylaxis     Prior to Admission medications   Medication Sig Start Date End Date Taking? Authorizing Provider  Prenatal Vit-Fe Fumarate-FA (MULTIVITAMIN-PRENATAL) 27-0.8 MG TABS tablet Take 1 tablet by mouth daily at 12 noon.   Yes Historical Provider, MD  sertraline (ZOLOFT) 25 MG tablet Take 50 mg by mouth daily.     Historical Provider, MD    OB History  Gravida Para Term Preterm AB SAB TAB Ectopic Multiple Living  5 1 1  3 3    1     # Outcome Date GA Lbr Len/2nd Weight Sex Delivery Anes PTL Lv  5 Current           4 SAB           3 Term     F Vag-Spont   Y  2 SAB           1 SAB               Prenatal care site: [facility]  Social History: She  reports that she has  quit smoking. She has never used smokeless tobacco. She reports that she does not drink alcohol.  Family History: family history includes Hypertension in her mother.   Review of Systems: Negative x 10 systems reviewed except as noted in the HPI.    Physical Exam:  Vital Signs: BP 125/92 mmHg  Pulse 94  Temp(Src) 98.1 F (36.7 C) (Oral)  Resp 18  Ht 5\' 9"  (1.753 m)  Wt 80.74 kg (178 lb)  BMI 26.27 kg/m2  LMP 09/03/2014  (multiple SBPs > 140, several DBPs >90) General: no acute distress.  HEENT: normocephalic, atraumatic Heart: regular rate & rhythm.  No murmurs/rubs/gallops Lungs: clear to auscultation bilaterally Abdomen: soft, gravid, non-tender;  EFW: 3,300 grams Pelvic:   External: Normal external female genitalia  Cervix: Dilation: Closed / Effacement (%): Thick / Station: -1   ROM: - nitrazine Extremities: non-tender, symmetric, no edema bilaterally.  DTRs: 2+  Neurologic: Alert & oriented x 3.    Pertinent Results:  Prenatal Labs: Blood type/Rh O  positive  Antibody screen negative  Rubella Immune  Varicella Immune    RPR NR  HBsAg negative  HIV negative  GC negative  Chlamydia negative  Genetic screening NIPT euploid, XX, msAFP neg  1 hour GTT 137  3 hour GTT n/a  GBS negative within the past five weeks   Baseline FHR: 140 beats/min   Variability: moderate   Accelerations: present   Decelerations: present (she has had two possible deep decelerations likely late given timing to contraction, down to 90s with return to baseline with moderate variability and accelerations after) Contractions: present frequency: irregular 2-3 q 10 minutes averaged over 30 minutes Overall assessment: category 2  Lab Results  Component Value Date   WBC 9.4 06/11/2015   HGB 12.2 06/11/2015   HCT 36.9 06/11/2015   PLT 196 06/11/2015   CREATININE 0.47 06/11/2015   PROTCRRATIO  not able to calculate (too low) 06/11/2015   Assessment:  Nancy Welch is a 40 y.o. G55P1031 female at  [redacted]w[redacted]d with gestational hypertension, category 2 fetal tracing, Advanced maternal age, history of maternal heart condition with PVCs.   Plan:  1. Admit to Labor & Delivery  2. CBC, T&S, NPO, IVF 3. GBS negative.   4. Fetwal well-being: reassuring overall, though guarded. Will monitor closely. She needs cervical ripening and induction.  However, I'm concerned that given the fetal tracing with spontaneous contractions that this will not be tolerated. 5. Maternal heart condition: will place on telemetry once laboring or PRN maternal symptoms   Conard Novak, MD 06/11/2015 8:05 AM

## 2015-06-12 ENCOUNTER — Inpatient Hospital Stay: Payer: Managed Care, Other (non HMO) | Admitting: Anesthesiology

## 2015-06-12 ENCOUNTER — Encounter: Payer: Self-pay | Admitting: *Deleted

## 2015-06-12 LAB — PLATELET COUNT: Platelets: 205 10*3/uL (ref 150–440)

## 2015-06-12 MED ORDER — NALBUPHINE HCL 10 MG/ML IJ SOLN: 5.0000 mg | Freq: Once | INTRAMUSCULAR | Status: DC | PRN

## 2015-06-12 MED ORDER — NALOXONE HCL 2 MG/2ML IJ SOSY: 1.0000 ug/kg/h | PREFILLED_SYRINGE | INTRAVENOUS | Status: DC | PRN

## 2015-06-12 MED ORDER — BUPIVACAINE HCL (PF) 0.25 % IJ SOLN
INTRAMUSCULAR | Status: DC | PRN
  Administered 2015-06-12 (×3): 5 mL via EPIDURAL

## 2015-06-12 MED ORDER — SIMETHICONE 80 MG PO CHEW: 80.0000 mg | CHEWABLE_TABLET | ORAL | Status: DC | PRN

## 2015-06-12 MED ORDER — OXYTOCIN 40 UNITS IN LACTATED RINGERS INFUSION - SIMPLE MED: 1.0000 m[IU]/min | INTRAVENOUS | Status: DC

## 2015-06-12 MED ORDER — NALBUPHINE HCL 10 MG/ML IJ SOLN: 5.0000 mg | INTRAMUSCULAR | Status: DC | PRN

## 2015-06-12 MED ORDER — FENTANYL 2.5 MCG/ML W/ROPIVACAINE 0.2% IN NS 100 ML EPIDURAL INFUSION (ARMC-ANES)
EPIDURAL | Status: AC
  Administered 2015-06-12: 10 mL/h via EPIDURAL
  Filled 2015-06-12: qty 100

## 2015-06-12 MED ORDER — SODIUM CHLORIDE 0.9% FLUSH: 3.0000 mL | INTRAVENOUS | Status: DC | PRN

## 2015-06-12 MED ORDER — DIPHENHYDRAMINE HCL 25 MG PO CAPS: 25.0000 mg | ORAL_CAPSULE | ORAL | Status: DC | PRN

## 2015-06-12 MED ORDER — OXYCODONE-ACETAMINOPHEN 5-325 MG PO TABS
1.0000 | ORAL_TABLET | ORAL | Status: DC | PRN
  Administered 2015-06-12 – 2015-06-14 (×8): 1 via ORAL
  Filled 2015-06-12 (×7): qty 1

## 2015-06-12 MED ORDER — PRENATAL MULTIVITAMIN CH
1.0000 | ORAL_TABLET | Freq: Every day | ORAL | Status: DC
  Administered 2015-06-13: 1 via ORAL
  Filled 2015-06-12: qty 1

## 2015-06-12 MED ORDER — LIDOCAINE HCL (PF) 1 % IJ SOLN
INTRAMUSCULAR | Status: AC
  Filled 2015-06-12: qty 30

## 2015-06-12 MED ORDER — LIDOCAINE-EPINEPHRINE (PF) 1.5 %-1:200000 IJ SOLN
INTRAMUSCULAR | Status: DC | PRN
  Administered 2015-06-12: 3 mL via EPIDURAL
  Administered 2015-06-12: 4 mL via PERINEURAL
  Administered 2015-06-12: 3 mL via PERINEURAL
  Administered 2015-06-12: 3 mL via EPIDURAL
  Administered 2015-06-12: 3 mL via PERINEURAL

## 2015-06-12 MED ORDER — OXYCODONE-ACETAMINOPHEN 5-325 MG PO TABS: 2.0000 | ORAL_TABLET | ORAL | Status: DC | PRN

## 2015-06-12 MED ORDER — KETOROLAC TROMETHAMINE 30 MG/ML IJ SOLN: 30.0000 mg | Freq: Four times a day (QID) | INTRAMUSCULAR | Status: AC | PRN

## 2015-06-12 MED ORDER — ONDANSETRON HCL 4 MG/2ML IJ SOLN: 4.0000 mg | Freq: Three times a day (TID) | INTRAMUSCULAR | Status: DC | PRN

## 2015-06-12 MED ORDER — MISOPROSTOL 200 MCG PO TABS
ORAL_TABLET | ORAL | Status: AC
  Filled 2015-06-12: qty 4

## 2015-06-12 MED ORDER — MEPERIDINE HCL 25 MG/ML IJ SOLN: 6.2500 mg | INTRAMUSCULAR | Status: DC | PRN

## 2015-06-12 MED ORDER — BENZOCAINE-MENTHOL 20-0.5 % EX AERO
1.0000 "application " | INHALATION_SPRAY | CUTANEOUS | Status: DC | PRN
  Administered 2015-06-13: 1 via TOPICAL
  Filled 2015-06-12: qty 56

## 2015-06-12 MED ORDER — OXYCODONE-ACETAMINOPHEN 5-325 MG PO TABS
ORAL_TABLET | ORAL | Status: AC
  Administered 2015-06-12: 1 via ORAL
  Filled 2015-06-12: qty 1

## 2015-06-12 MED ORDER — FENTANYL 2.5 MCG/ML W/ROPIVACAINE 0.2% IN NS 100 ML EPIDURAL INFUSION (ARMC-ANES): 10.0000 mL/h | EPIDURAL | Status: DC

## 2015-06-12 MED ORDER — AMMONIA AROMATIC IN INHA
RESPIRATORY_TRACT | Status: AC
  Filled 2015-06-12: qty 10

## 2015-06-12 MED ORDER — SENNOSIDES-DOCUSATE SODIUM 8.6-50 MG PO TABS
2.0000 | ORAL_TABLET | ORAL | Status: DC
  Administered 2015-06-13 (×2): 2 via ORAL
  Filled 2015-06-12 (×2): qty 2

## 2015-06-12 MED ORDER — IBUPROFEN 600 MG PO TABS
ORAL_TABLET | ORAL | Status: AC
  Administered 2015-06-12: 600 mg via ORAL
  Filled 2015-06-12: qty 1

## 2015-06-12 MED ORDER — ONDANSETRON HCL 4 MG/2ML IJ SOLN
4.0000 mg | INTRAMUSCULAR | Status: DC | PRN
Start: 2015-06-12 — End: 2015-06-14

## 2015-06-12 MED ORDER — NALOXONE HCL 0.4 MG/ML IJ SOLN: 0.4000 mg | INTRAMUSCULAR | Status: DC | PRN

## 2015-06-12 MED ORDER — ONDANSETRON HCL 4 MG PO TABS: 4.0000 mg | ORAL_TABLET | ORAL | Status: DC | PRN

## 2015-06-12 MED ORDER — COCONUT OIL OIL: 1.0000 "application " | TOPICAL_OIL | Status: DC | PRN

## 2015-06-12 MED ORDER — DIPHENHYDRAMINE HCL 50 MG/ML IJ SOLN: 12.5000 mg | INTRAMUSCULAR | Status: DC | PRN

## 2015-06-12 MED ORDER — IBUPROFEN 600 MG PO TABS
600.0000 mg | ORAL_TABLET | Freq: Four times a day (QID) | ORAL | Status: DC | PRN
  Administered 2015-06-12 – 2015-06-14 (×6): 600 mg via ORAL
  Filled 2015-06-12 (×5): qty 1

## 2015-06-12 MED ORDER — OXYTOCIN 40 UNITS IN LACTATED RINGERS INFUSION - SIMPLE MED
1.0000 m[IU]/min | INTRAVENOUS | Status: DC
  Administered 2015-06-12: 1 m[IU]/min via INTRAVENOUS
  Administered 2015-06-12: 5 m[IU]/min via INTRAVENOUS
  Administered 2015-06-12: 2 m[IU]/min via INTRAVENOUS

## 2015-06-12 MED ORDER — FERROUS SULFATE 325 (65 FE) MG PO TABS
325.0000 mg | ORAL_TABLET | Freq: Every day | ORAL | Status: DC
  Administered 2015-06-13 – 2015-06-14 (×2): 325 mg via ORAL
  Filled 2015-06-12 (×2): qty 1

## 2015-06-12 NOTE — Anesthesia Preprocedure Evaluation (Signed)
Anesthesia Evaluation   Patient awake    Reviewed: Allergy & Precautions, H&P , NPO status , Patient's Chart, lab work & pertinent test results  History of Anesthesia Complications Negative for: history of anesthetic complications  Airway Mallampati: II   Neck ROM: full    Dental no notable dental hx.    Pulmonary former smoker,           Cardiovascular hypertension, + Valvular Problems/Murmurs      Neuro/Psych PSYCHIATRIC DISORDERS    GI/Hepatic   Endo/Other    Renal/GU      Musculoskeletal   Abdominal   Peds  Hematology   Anesthesia Other Findings   Reproductive/Obstetrics (+) Pregnancy                             Anesthesia Physical Anesthesia Plan  ASA: II  Anesthesia Plan: Epidural   Post-op Pain Management:    Induction:   Airway Management Planned:   Additional Equipment:   Intra-op Plan:   Post-operative Plan:   Informed Consent: I have reviewed the patients History and Physical, chart, labs and discussed the procedure including the risks, benefits and alternatives for the proposed anesthesia with the patient or authorized representative who has indicated his/her understanding and acceptance.     Plan Discussed with: Anesthesiologist  Anesthesia Plan Comments:         Anesthesia Quick Evaluation

## 2015-06-12 NOTE — Progress Notes (Signed)
L&D progress Note  40 yr old G5 P1031 at 40.2 weeks admitted last night with gestational hypertension for IOL. Received Cervidil yesterday and started on Pitocin during the night. Has progressed from 1.5 to 3+ cm   S: Rates contraction pain 3/10. In good spirits  O: BP 116/78 mmHg  Pulse 102  Temp(Src) 98.8 F (37.1 C) (Oral)  Resp 18  Ht 5\' 9"  (1.753 m)  Wt 80.74 kg (178 lb)  BMI 26.27 kg/m2  LMP 09/03/2014  Blood pressures- normal to mild range (130s/90s) overnight General : alert, in NAD FHR: 140s with moderate variability, no deceleratioons Toco: contractions q 3-5 min on 5 miu/min Pitocin Cervix: 3/50%/-2/ ROT per ultrasound  A: Gestational hypertension-normal to mild range blood pressures and normal labs IOL- day 2 has progressed with Cervidil and then Pitocin FWB: reassuring FHR tracing   P: Continue to monitor blood pressures closely Continue Pitocin Can ambulate or sit on birthing ball with continuous monitoring Epidural when more active Plan epidural when more active then AROM.  Farrel ConnersGUTIERREZ, Donye Campanelli, CNM

## 2015-06-12 NOTE — Progress Notes (Addendum)
L&D Progress Note  S: Can move legs after epidural, rating pain on right at 1-2/10. Felt gush of fluid  O: General: appears comfortable, large puddle of light yellow fluid under her on Chux BP 122/85 mmHg  Pulse 109  Temp(Src) 99 F (37.2 C) (Oral)  Resp 18  Ht 5\' 9"  (1.753 m)  Wt 80.74 kg (178 lb)  BMI 26.27 kg/m2  LMP 09/03/2014  FHR: 130 with moderate variability and one deceleration to 90 BPM x 60 sec, prior to SROM of light yellow fluid. AROM of forebag with moderate dark green MSAF. Cervix: 4/60%/-1 to -2 FSE applied Toco: contractions q 2-3 min apart, just started back on 2 miu/min Pitocin when SROM occurred. IUPC inserted  A: MSAF with  Overall reassuring FHR pattern  P: Titrate Pitocin to 200 mvu Monitor fetal heart rate pattern closely  Nancy Welch, CNM

## 2015-06-12 NOTE — Anesthesia Procedure Notes (Signed)
Epidural Patient location during procedure: OB Start time: 06/12/2015 12:40 PM End time: 06/12/2015 12:50 PM  Staffing Anesthesiologist: Lenard SimmerKARENZ, Shalev Helminiak Performed by: anesthesiologist   Preanesthetic Checklist Completed: patient identified, site marked, surgical consent, pre-op evaluation, timeout performed, IV checked, risks and benefits discussed and monitors and equipment checked  Epidural Patient position: sitting Prep: ChloraPrep Patient monitoring: heart rate, continuous pulse ox and blood pressure Approach: midline Location: L4-L5 Injection technique: LOR saline  Needle:  Needle type: Tuohy  Needle gauge: 17 G Needle length: 9 cm and 9 Needle insertion depth: 5 cm Catheter type: closed end flexible Catheter size: 19 Gauge Catheter at skin depth: 10 cm Test dose: negative and 1.5% lidocaine with Epi 1:200 K  Assessment Sensory level: T8 Events: blood not aspirated, injection not painful, no injection resistance, negative IV test and no paresthesia  Additional Notes   Patient tolerated the insertion well without immediate complications.Reason for block:procedure for pain

## 2015-06-12 NOTE — Progress Notes (Addendum)
L&D Note  06/12/2015 - 5:34 PM  40 y.o. [id number] [redacted]w[redacted]d. Pregnancy complicated by AMA, PP depression, maternal bicuspid aortic valve  [name] is admitted for IOL for gHTN and category II tracing   Subjective: pt comfortable with epidural in place and has some movement and feeling  Filed Vitals:   06/12/15 1630 06/12/15 1719 06/12/15 1720 06/12/15 1730  BP: 135/96  132/96 144/95  Pulse: 98  93 86  Temp:  98.4 F (36.9 C) 98.4 F (36.9 C)   TempSrc:  Oral Oral   Resp:   18 18  Height:      Weight:        FHR: 135 baseline, occasional accels, rare early decels and +variables, mod variability Toco: q12m  NAD SVE: 8/100/+ @ 1725 from 7cm at 1705 Moderate mec at the introitus  Labs: no new labs  Medications Current Facility-Administered Medications  Medication Dose Route Frequency Provider Last Rate Last Dose  . ammonia inhalant           . citric acid-sodium citrate (ORACIT) solution 30 mL  30 mL Oral Q2H PRN Conard Novak, MD      . lactated ringers infusion 500-1,000 mL  500-1,000 mL Intravenous PRN Conard Novak, MD 1,000 mL/hr at 06/12/15 1714 300 mL at 06/12/15 1714  . lactated ringers infusion   Intravenous Continuous Conard Novak, MD 125 mL/hr at 06/12/15 1315 125 mL/hr at 06/12/15 1315  . lidocaine (PF) (XYLOCAINE) 1 % injection 30 mL  30 mL Subcutaneous PRN Conard Novak, MD      . lidocaine (PF) (XYLOCAINE) 1 % injection           . misoprostol (CYTOTEC) 200 MCG tablet           . ondansetron (ZOFRAN) injection 4 mg  4 mg Intravenous Q6H PRN Conard Novak, MD      . oxytocin (PITOCIN) injection 10 Units  10 Units Intramuscular Once Conard Novak, MD      . oxytocin (PITOCIN) IV BOLUS FROM BAG  500 mL Intravenous Continuous Conard Novak, MD      . oxytocin (PITOCIN) IV infusion 40 units in LR 1000 mL - Premix  2.5 Units/hr Intravenous Continuous Conard Novak, MD      . oxytocin (PITOCIN) IV infusion 40 units in LR 1000 mL -  Premix  1-40 milli-units/min Intravenous Titrated Farrel Conners, CNM 7.5 mL/hr at 06/12/15 1617 5 milli-units/min at 06/12/15 1617  . sertraline (ZOLOFT) tablet 50 mg  50 mg Oral QHS Conard Novak, MD   50 mg at 06/11/15 2200  . terbutaline (BRETHINE) injection 0.25 mg  0.25 mg Subcutaneous Once PRN Conard Novak, MD       Facility-Administered Medications Ordered in Other Encounters  Medication Dose Route Frequency Provider Last Rate Last Dose  . bupivacaine (PF) (MARCAINE) 0.25 % injection    Anesthesia Intra-op Mathews Argyle, CRNA   5 mL at 06/12/15 1526  . lidocaine-EPINEPHrine 1.5 %-1:200000 injection    Anesthesia Intra-op Lenard Simmer, MD   3 mL at 06/12/15 1257    Assessment & Plan:  Pt stable; pt seen in conjunction with CNM Sharen Hones *IUP: category II tracing with FSE and IUPC in place. Amnioinfusion started and fetus with normal baseline and moderate variability. Pt now making great cervical change and with AI just started, will give it time to work and d/w pt that she is now in active labor and will keep close  eye on her labor curve. Continue pitocin at current level and wouldn't increase it until fetus has recovered and if fetus not tolerating 2nd stage well, will d/w her and husband regarding OVD. Will try and obtain cord gases at delivery given indication for IOL was for category II tracing and it has persisted at times during her labor. Peds @ delivery for meconium and tracing *CV: no indication for SBE ppx no matter mode of delivery. Pt on telemetry. Pt declined fetal echo; s/p normal anatomy scan and normal cffdna *gHTN: neg pre-x work up. Mild range BPs *GBS: neg *Analgesia: epidural currently  Cornelia Copaharlie Gevorg Brum, Jr MD [facility] Pager [phone]

## 2015-06-12 NOTE — Progress Notes (Signed)
L&D Note  06/12/2015 - 12:34 PM  40 y.o. [id number] [redacted]w[redacted]d. Pregnancy complicated by AMA, PP depression, maternal bicuspid aortic valve  Ms. Chinyere Onofre is admitted for IOL for gHTN and category II tracing   Pt in with anesthesia for epidural placement. Pt desired epidural prior to SVE and AROM, per CNM [name]  Filed Vitals:   06/12/15 1004 06/12/15 1130 06/12/15 1143 06/12/15 1215  BP: 137/92 104/78 115/84 138/97  Pulse: 103 110 108 100  Temp:  99 F (37.2 C)    TempSrc:  Oral    Resp:  18    Height:      Weight:        FHR: 135 baseline, occasional accels, no decel in the past hour, mod variability  Toco: q3-78m  Labs:   Recent Labs Lab 06/11/15 0254 06/12/15 0429  WBC 9.4  --   HGB 12.2  --   HCT 36.9  --   PLT 196 205    Recent Labs Lab 06/11/15 0254  NA 136  K 3.5  CL 104  CO2 23  BUN 8  CREATININE 0.47  CALCIUM 8.9  PROT 6.6  BILITOT 0.9  ALKPHOS 103  ALT 13*  AST 21  GLUCOSE 93   PC ratio: negative  Medications Current Facility-Administered Medications  Medication Dose Route Frequency Provider Last Rate Last Dose  . citric acid-sodium citrate (ORACIT) solution 30 mL  30 mL Oral Q2H PRN Conard Novak, MD      . fentanyl 2.5 mcg/ml w/ropivacaine 0.2% in normal saline 100 mL EPIDURAL Infusion 2 mcg/ml           . lactated ringers infusion 500-1,000 mL  500-1,000 mL Intravenous PRN Conard Novak, MD 1,000 mL/hr at 06/12/15 1211 500 mL at 06/12/15 1211  . lactated ringers infusion   Intravenous Continuous Conard Novak, MD 125 mL/hr at 06/12/15 0913 125 mL/hr at 06/12/15 0913  . lidocaine (PF) (XYLOCAINE) 1 % injection 30 mL  30 mL Subcutaneous PRN Conard Novak, MD      . ondansetron [facility]) injection 4 mg  4 mg Intravenous Q6H PRN Conard Novak, MD      . oxytocin (PITOCIN) injection 10 Units  10 Units Intramuscular Once Conard Novak, MD      . oxytocin (PITOCIN) IV BOLUS FROM BAG  500 mL Intravenous Continuous Conard Novak, MD      . oxytocin (PITOCIN) IV infusion 40 units in LR 1000 mL - Premix  2.5 Units/hr Intravenous Continuous Conard Novak, MD      . oxytocin (PITOCIN) IV infusion 40 units in LR 1000 mL - Premix  1-40 milli-units/min Intravenous Titrated Conard Novak, MD   Stopped at 06/12/15 1126  . sertraline (ZOLOFT) tablet 50 mg  50 mg Oral QHS Conard Novak, MD   50 mg at 06/11/15 2200  . terbutaline (BRETHINE) injection 0.25 mg  0.25 mg Subcutaneous Once PRN Conard Novak, MD        Assessment & Plan:  Pt stable *IUP: category I tracing currently. She has had periods of min to mod variability +/-subtle late decels but has looked good in between with +accels, normal baseline and mod variability.  *IOL: d/w CNM Sharen Hones that given how fetus looks well for most of the EFM and her multip status, recommend AROM, internals and hoping that will get her into active labor. Pitocin off during epidural and she will reassess for AROM and place internals  and start pitocin. Will come by and discuss plan of care with patient if not delivered, later in the afternoon *CV: no indication for SBE ppx no matter mode of delivery. Pt on telemetry. Pt declined fetal echo; s/p normal anatomy scan and normal cffdna *gHTN: neg pre-x work up. Mild range BPs *GBS: neg *Analgesia: epidural being placed now  Cornelia Copaharlie Parmvir Boomer, Jr MD [facility] Pager [phone]

## 2015-06-13 LAB — CBC
HCT: 32.4 % — ABNORMAL LOW (ref 35.0–47.0)
Hemoglobin: 11 g/dL — ABNORMAL LOW (ref 12.0–16.0)
MCH: 29.6 pg (ref 26.0–34.0)
MCHC: 34 g/dL (ref 32.0–36.0)
MCV: 86.9 fL (ref 80.0–100.0)
Platelets: 183 10*3/uL (ref 150–440)
RBC: 3.73 MIL/uL — ABNORMAL LOW (ref 3.80–5.20)
RDW: 14.1 % (ref 11.5–14.5)
WBC: 12.1 10*3/uL — ABNORMAL HIGH (ref 3.6–11.0)

## 2015-06-13 NOTE — Anesthesia Postprocedure Evaluation (Signed)
Anesthesia Post Note  Patient: Nancy Welch  Procedure(s) Performed: * No procedures listed *  Patient location during evaluation: Mother Baby Anesthesia Type: Epidural Level of consciousness: awake and alert and oriented Pain management: satisfactory to patient Vital Signs Assessment: post-procedure vital signs reviewed and stable Respiratory status: respiratory function stable Cardiovascular status: stable Postop Assessment: no headache, no backache, patient able to bend at knees, no signs of nausea or vomiting and adequate PO intake Anesthetic complications: no    Last Vitals:  Filed Vitals:   06/12/15 2350 06/13/15 0353  BP: 109/70 127/78  Pulse: 79 65  Temp: 37.2 C 36.7 C  Resp: 20 20    Last Pain:  Filed Vitals:   06/13/15 0406  PainSc: 6                  Miller Edgington,  Rosanne SackKasey A

## 2015-06-13 NOTE — Progress Notes (Addendum)
Post Partum Day 1 Subjective: Doing well, no complaints.  Tolerating regular diet, pain with PO meds, voiding and ambulating without difficulty.  No CP SOB F/C N/V or leg pain No HA, change of vision, RUQ/epigastric pain  Objective: BP 127/81 mmHg  Pulse 77  Temp(Src) 98.9 F (37.2 C) (Oral)  Resp 18  Ht 5\' 9"  (1.753 m)  Wt 80.74 kg (178 lb)  BMI 26.27 kg/m2  LMP 09/03/2014  Breastfeeding  Physical Exam:  General: NAD CV: RRR Pulm: nl effort, CTABL Lochia: moderate Uterine Fundus: fundus firm and below umbilicus DVT Evaluation: no cords, ttp LEs    Recent Labs  06/11/15 0254 06/12/15 0429 06/13/15 0400  HGB 12.2  --  11.0*  HCT 36.9  --  32.4*  WBC 9.4  --  12.1*  PLT 196 205 183    Assessment/Plan: 40 y.o. [id number] postpartum day #   1. Continue routine postpartum care 2. Breastfeeding 3. Contraception: Minipill 4. TDAP UTD 5. Continue Zoloft for hx ppd 6. Disposition: home tomorrow    Tresea MallGLEDHILL,Elisse Pennick, CNM

## 2015-06-14 MED ORDER — SERTRALINE HCL 50 MG PO TABS: 50.0000 mg | ORAL_TABLET | Freq: Every day | ORAL | Status: DC

## 2015-06-14 MED ORDER — NORETHINDRONE 0.35 MG PO TABS: 1.0000 | ORAL_TABLET | Freq: Every day | ORAL | Status: DC

## 2015-06-14 MED ORDER — OXYCODONE-ACETAMINOPHEN 5-325 MG PO TABS: 1.0000 | ORAL_TABLET | ORAL | Status: DC | PRN

## 2015-06-14 MED ORDER — IBUPROFEN 600 MG PO TABS
600.0000 mg | ORAL_TABLET | Freq: Four times a day (QID) | ORAL | Status: DC | PRN
Start: 2015-06-14 — End: 2017-04-21

## 2015-06-14 NOTE — Discharge Instructions (Signed)
Discharge instructions:  ° °Call office if you have any of the following: headache, visual changes, fever >100 F, chills, breast concerns, excessive vaginal bleeding, incision drainage or problems, leg pain or redness, depression or any other concerns.  ° °Activity: Do not lift > 10 lbs for 6 weeks.  °No intercourse or tampons for 6 weeks.  °No driving for 1-2 weeks.  ° °

## 2015-06-14 NOTE — Progress Notes (Signed)
Discharge instructions reviewed with pt and verb u/o.  Rx given for home use.  To car via auxillary

## 2015-06-14 NOTE — Discharge Summary (Signed)
Obstetric Discharge Summary Reason for Admission: induction of labor for AMA and GHTN Delivery Type: spontaneous vaginal delivery Postpartum Procedures: none Complications-Intrapartum or Postpartum: none    Recent Labs  06/13/15 0400  HGB 11.0*  HCT 32.4*   Admission Hct: 36.9   Gestational Age at Delivery: 5820w2d  Antepartum complications: none Date of Delivery: 06/12/15  Delivered By: Nancy Welch, CNM   Physical Exam:  General: alert and cooperative Lochia: appropriate Uterine Fundus: firm Incision: N/A DVT Evaluation: No evidence of DVT seen on physical exam. Abdomen: abdomen is soft without significant tenderness, masses, organomegaly or guarding  Prenatal Labs Blood Type: O+ Rubella: Immune Varicella: Immune TDAP: Given during pregnancy Feeding: Bottle Contraception: minipill  Discharge Diagnoses: Term Pregnancy-delivered  Discharge Information: Date: 06/14/2015 Activity: pelvic rest Diet: routine Medications: PNV, Ibuprofen, Percocet and zoloft Condition: stable Instructions:  Discharge instructions:   Call office if you have any of the following: headache, visual changes, fever >100 F, chills, breast concerns, excessive vaginal bleeding, incision drainage or problems, leg pain or redness, depression or any other concerns.   Activity: Do not lift > 10 lbs for 6 weeks.  No intercourse or tampons for 6 weeks.  No driving for 1-2 weeks.   Discharge to: home Follow-up Information    Go to to follow up.   Why:  For BP check      Follow up with Nancy Welch, CNM. Schedule an appointment as soon as possible for a visit in 6 weeks.   Specialty:  Certified Nurse Midwife   Why:  postpartum check   Contact information:   [address] [place] [id number] [phone]       Newborn Data: Live born female  Birth Weight: 7 lb 10.4 oz (3470 g) APGAR: 9, 9  Home with mother.  Nancy AntuBrothers, Nancy Welch, [place] 06/14/2015, 10:21 AM
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
## 2015-09-01 ENCOUNTER — Ambulatory Visit (INDEPENDENT_AMBULATORY_CARE_PROVIDER_SITE_OTHER): Payer: Managed Care, Other (non HMO) | Admitting: Family Medicine

## 2015-09-01 ENCOUNTER — Encounter: Payer: Self-pay | Admitting: Family Medicine

## 2015-09-01 VITALS — BP 122/60 | HR 78 | Temp 97.7°F | Resp 14 | Ht 69.0 in | Wt 152.0 lb

## 2015-09-01 DIAGNOSIS — M542 Cervicalgia: Secondary | ICD-10-CM | POA: Diagnosis not present

## 2015-09-01 DIAGNOSIS — G8929 Other chronic pain: Secondary | ICD-10-CM

## 2015-09-01 DIAGNOSIS — F419 Anxiety disorder, unspecified: Secondary | ICD-10-CM | POA: Diagnosis not present

## 2015-09-01 DIAGNOSIS — Z0000 Encounter for general adult medical examination without abnormal findings: Secondary | ICD-10-CM | POA: Diagnosis not present

## 2015-09-01 LAB — LIPID PANEL
Cholesterol: 182 mg/dL (ref 125–200)
HDL: 88 mg/dL (ref >=46)
LDL Cholesterol: 87 mg/dL (ref <130)
Total CHOL/HDL Ratio: 2.1 Ratio (ref <=5.0)
Triglycerides: 33 mg/dL (ref <150)
VLDL: 7 mg/dL (ref <30)

## 2015-09-01 LAB — COMPREHENSIVE METABOLIC PANEL
ALT: 26 U/L (ref 6–29)
AST: 24 U/L (ref 10–30)
Albumin: 4.1 g/dL (ref 3.6–5.1)
Alkaline Phosphatase: 63 U/L (ref 33–115)
BUN: 14 mg/dL (ref 7–25)
CO2: 26 mmol/L (ref 20–31)
Calcium: 9.4 mg/dL (ref 8.6–10.2)
Chloride: 102 mmol/L (ref 98–110)
Creat: 0.9 mg/dL (ref 0.50–1.10)
Glucose, Bld: 83 mg/dL (ref 70–99)
Potassium: 4.1 mmol/L (ref 3.5–5.3)
Sodium: 135 mmol/L (ref 135–146)
Total Bilirubin: 1.4 mg/dL — ABNORMAL HIGH (ref 0.2–1.2)
Total Protein: 6.6 g/dL (ref 6.1–8.1)

## 2015-09-01 LAB — TSH: TSH: 0.62 mIU/L

## 2015-09-01 LAB — CBC WITH DIFFERENTIAL/PLATELET
Basophils Absolute: 0 cells/uL (ref 0–200)
Basophils Relative: 0 %
Eosinophils Absolute: 282 cells/uL (ref 15–500)
Eosinophils Relative: 6 %
HCT: 38.7 % (ref 35.0–45.0)
Hemoglobin: 12.7 g/dL (ref 12.0–15.0)
Lymphocytes Relative: 45 %
Lymphs Abs: 2115 cells/uL (ref 850–3900)
MCH: 28.2 pg (ref 27.0–33.0)
MCHC: 32.8 g/dL (ref 32.0–36.0)
MCV: 85.8 fL (ref 80.0–100.0)
MPV: 10 fL (ref 7.5–12.5)
Monocytes Absolute: 517 cells/uL (ref 200–950)
Monocytes Relative: 11 %
Neutro Abs: 1786 cells/uL (ref 1500–7800)
Neutrophils Relative %: 38 %
Platelets: 239 10*3/uL (ref 140–400)
RBC: 4.51 MIL/uL (ref 3.80–5.10)
RDW: 13.9 % (ref 11.0–15.0)
WBC: 4.7 10*3/uL (ref 3.8–10.8)

## 2015-09-01 MED ORDER — CYCLOBENZAPRINE HCL 5 MG PO TABS: 5.0000 mg | ORAL_TABLET | Freq: Three times a day (TID) | ORAL | 1 refills | Status: DC | PRN

## 2015-09-01 NOTE — Assessment & Plan Note (Signed)
Controlled with zoloft.  

## 2015-09-01 NOTE — Patient Instructions (Signed)
F/U as needed or in 1 year Try the flexeril
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
## 2015-09-01 NOTE — Assessment & Plan Note (Signed)
She is BF will use flexeril only if severe continue topical tylenol for now No need for imaging at this time

## 2015-10-27 ENCOUNTER — Ambulatory Visit (INDEPENDENT_AMBULATORY_CARE_PROVIDER_SITE_OTHER): Payer: Managed Care, Other (non HMO) | Admitting: *Deleted

## 2015-10-27 DIAGNOSIS — Z23 Encounter for immunization: Secondary | ICD-10-CM | POA: Diagnosis not present

## 2015-10-27 NOTE — Progress Notes (Signed)
Patient seen in office for Influenza Vaccination.   Tolerated IM administration well.   Immunization history updated.  

## 2015-10-31 ENCOUNTER — Encounter: Payer: Self-pay | Admitting: Family Medicine

## 2016-03-08 ENCOUNTER — Ambulatory Visit (INDEPENDENT_AMBULATORY_CARE_PROVIDER_SITE_OTHER): Payer: Managed Care, Other (non HMO)

## 2016-03-08 ENCOUNTER — Other Ambulatory Visit: Payer: Managed Care, Other (non HMO)

## 2016-03-08 ENCOUNTER — Other Ambulatory Visit: Payer: Self-pay

## 2016-03-08 DIAGNOSIS — Z0184 Encounter for antibody response examination: Secondary | ICD-10-CM

## 2016-03-08 DIAGNOSIS — Z111 Encounter for screening for respiratory tuberculosis: Secondary | ICD-10-CM | POA: Diagnosis not present

## 2016-03-09 LAB — HEPATITIS B SURFACE ANTIBODY, QUANTITATIVE: Hepatitis B-Post: 38.4 m[IU]/mL

## 2016-03-11 ENCOUNTER — Ambulatory Visit: Payer: Managed Care, Other (non HMO) | Admitting: Family Medicine

## 2016-03-11 DIAGNOSIS — Z111 Encounter for screening for respiratory tuberculosis: Secondary | ICD-10-CM

## 2016-03-11 LAB — TB SKIN TEST
Induration: 0 mm
TB Skin Test: NEGATIVE

## 2016-03-11 NOTE — Patient Instructions (Signed)
Pt came for reading of PPD TB skin test.  Sight was clear of any redness or induration.  Results of PPD and Hep B titer given to patient for school.

## 2016-05-08 ENCOUNTER — Telehealth: Payer: Self-pay | Admitting: Family Medicine

## 2016-05-08 NOTE — Telephone Encounter (Signed)
-----   Message from Durwin Nora Six, LPN sent at 02/16/1476 12:17 PM EDT ----- Regarding: RE: check on pt  Call placed to [facility] to F/U about Kenna.   Etoile states that Shelva Majestic is much improved. Fever has resolved and patient no longer has drainage from eyes and nose.   Patient has returned to daycare as of 05/08/2016. ----- Message ----- From: Salley Scarlet, MD Sent: 05/08/2016   9:33 AM To: Durwin Nora Six, LPN Subject: FW: check on pt                                 Please check on pt, see if fever down, improving  ----- Message ----- From: Salley Scarlet, MD Sent: 05/08/2016 To: Salley Scarlet, MD Subject: check on pt

## 2016-07-23 ENCOUNTER — Ambulatory Visit: Payer: Managed Care, Other (non HMO) | Admitting: Obstetrics and Gynecology

## 2016-07-24 ENCOUNTER — Ambulatory Visit: Payer: Managed Care, Other (non HMO) | Admitting: Obstetrics & Gynecology

## 2016-07-30 ENCOUNTER — Ambulatory Visit (INDEPENDENT_AMBULATORY_CARE_PROVIDER_SITE_OTHER): Payer: Managed Care, Other (non HMO) | Admitting: Obstetrics & Gynecology

## 2016-07-30 ENCOUNTER — Encounter: Payer: Self-pay | Admitting: Obstetrics & Gynecology

## 2016-07-30 VITALS — BP 120/80 | HR 86 | Ht 70.0 in | Wt 149.0 lb

## 2016-07-30 DIAGNOSIS — Z0000 Encounter for general adult medical examination without abnormal findings: Secondary | ICD-10-CM

## 2016-07-30 DIAGNOSIS — Z87410 Personal history of cervical dysplasia: Secondary | ICD-10-CM | POA: Diagnosis not present

## 2016-07-30 DIAGNOSIS — Z01419 Encounter for gynecological examination (general) (routine) without abnormal findings: Secondary | ICD-10-CM | POA: Diagnosis not present

## 2016-07-30 DIAGNOSIS — Z3041 Encounter for surveillance of contraceptive pills: Secondary | ICD-10-CM

## 2016-07-30 DIAGNOSIS — Z1231 Encounter for screening mammogram for malignant neoplasm of breast: Secondary | ICD-10-CM | POA: Diagnosis not present

## 2016-07-30 DIAGNOSIS — Z1239 Encounter for other screening for malignant neoplasm of breast: Secondary | ICD-10-CM

## 2016-07-30 MED ORDER — NORETHIN-ETH ESTRAD-FE BIPHAS 1 MG-10 MCG / 10 MCG PO TABS: 1.0000 | ORAL_TABLET | Freq: Every day | ORAL | 3 refills | Status: DC

## 2016-07-30 MED ORDER — FLUCONAZOLE 150 MG PO TABS: 150.0000 mg | ORAL_TABLET | ORAL | 5 refills | Status: AC

## 2016-07-30 NOTE — Patient Instructions (Signed)
PAP every year Mammogram every year    Call [phone] to schedule Labs yearly (with PCP)  Diflucan weekly to suppress breast thrush  Change to regular birth control pill  Ethinyl Estradiol; Norethindrone Acetate; Ferrous fumarate tablets or capsules What is this medicine? ETHINYL ESTRADIOL; NORETHINDRONE ACETATE; FERROUS FUMARATE (ETH in il es tra DYE ole; nor eth IN drone AS e tate; FER us FUE ma rate) is an oral contraceptive. The products combine two types of female hormones, an estrogen and a progestin. They are used to prevent ovulation and pregnancy. Some products are also used to treat acne in females. This medicine may be used for other purposes; ask your health care provider or pharmacist if you have questions. COMMON BRAND NAME(S): Blisovi [address]24 Fe, Blisovi Fe, Estrostep Fe, Gildess [address] Fe, Gildess Fe 1.5/30, Gildess Fe 1/20, Junel Fe 1.5/30, Junel Fe 1/20, Junel Fe 24, Larin Fe, Lo Loestrin Fe, Loestrin 24 Fe, Loestrin FE 1.5/30, Loestrin FE 1/20, Lomedia 24 Fe, Microgestin 24 Fe, Microgestin Fe 1.5/30, Microgestin Fe 1/20, Tarina Fe 1/20, Taytulla, Tilia Fe, Tri-Legest Fe What should I tell my health care provider before I take this medicine? They need to know if you have any of these conditions: -abnormal vaginal bleeding -blood vessel disease -breast, cervical, endometrial, ovarian, liver, or uterine cancer -diabetes -gallbladder disease -heart disease or recent heart attack -high blood pressure -high cholesterol -history of blood clots -kidney disease -liver disease -migraine headaches -smoke tobacco -stroke -systemic lupus erythematosus (SLE) -an unusual or allergic reaction to estrogens, progestins, other medicines, foods, dyes, or preservatives -pregnant or trying to get pregnant -breast-feeding How should I use this medicine? Take this medicine by mouth. To reduce nausea, this medicine may be taken with food. Follow the directions on the prescription label. Take this  medicine at the same time each day and in the order directed on the package. Do not take your medicine more often than directed. A patient package insert for the product will be given with each prescription and refill. Read this sheet carefully each time. The sheet may change frequently. Contact your pediatrician regarding the use of this medicine in children. Special care may be needed. This medicine has been used in female children who have started having menstrual periods. Overdosage: If you think you have taken too much of this medicine contact a poison control center or emergency room at once. NOTE: This medicine is only for you. Do not share this medicine with others. What if I miss a dose? If you miss a dose, refer to the patient information sheet you received with your medicine for direction. If you miss more than one pill, this medicine may not be as effective and you may need to use another form of birth control. What may interact with this medicine? Do not take this medicine with the following medication: -dasabuvir; ombitasvir; paritaprevir; ritonavir -ombitasvir; paritaprevir; ritonavir This medicine may also interact with the following medications: -acetaminophen -antibiotics or medicines for infections, especially rifampin, rifabutin, rifapentine, and griseofulvin, and possibly penicillins or tetracyclines -aprepitant -ascorbic acid (vitamin C) -atorvastatin -barbiturate medicines, such as phenobarbital -bosentan -carbamazepine -caffeine -clofibrate -cyclosporine -dantrolene -doxercalciferol -felbamate -grapefruit juice -hydrocortisone -medicines for anxiety or sleeping problems, such as diazepam or temazepam -medicines for diabetes, including pioglitazone -mineral oil -modafinil -mycophenolate -nefazodone -oxcarbazepine -phenytoin -prednisolone -ritonavir or other medicines for HIV infection or AIDS -rosuvastatin -selegiline -soy isoflavones supplements -St.  John's wort -tamoxifen or raloxifene -theophylline -thyroid hormones -topiramate -warfarin This list may not describe all possible interactions.  Give your health care provider a list of all the medicines, herbs, non-prescription drugs, or dietary supplements you use. Also tell them if you smoke, drink alcohol, or use illegal drugs. Some items may interact with your medicine. What should I watch for while using this medicine? Visit your doctor or health care professional for regular checks on your progress. You will need a regular breast and pelvic exam and Pap smear while on this medicine. Use an additional method of contraception during the first cycle that you take these tablets. If you have any reason to think you are pregnant, stop taking this medicine right away and contact your doctor or health care professional. If you are taking this medicine for hormone related problems, it may take several cycles of use to see improvement in your condition. Smoking increases the risk of getting a blood clot or having a stroke while you are taking birth control pills, especially if you are more than 41 years old. You are strongly advised not to smoke. This medicine can make your body retain fluid, making your fingers, hands, or ankles swell. Your blood pressure can go up. Contact your doctor or health care professional if you feel you are retaining fluid. This medicine can make you more sensitive to the sun. Keep out of the sun. If you cannot avoid being in the sun, wear protective clothing and use sunscreen. Do not use sun lamps or tanning beds/booths. If you wear contact lenses and notice visual changes, or if the lenses begin to feel uncomfortable, consult your eye care specialist. In some women, tenderness, swelling, or minor bleeding of the gums may occur. Notify your dentist if this happens. Brushing and flossing your teeth regularly may help limit this. See your dentist regularly and inform your  dentist of the medicines you are taking. If you are going to have elective surgery, you may need to stop taking this medicine before the surgery. Consult your health care professional for advice. This medicine does not protect you against HIV infection (AIDS) or any other sexually transmitted diseases. What side effects may I notice from receiving this medicine? Side effects that you should report to your doctor or health care professional as soon as possible: -allergic reactions like skin rash, itching or hives, swelling of the face, lips, or tongue -breast tissue changes or discharge -changes in vaginal bleeding during your period or between your periods -changes in vision -chest pain -confusion -coughing up blood -dizziness -feeling faint or lightheaded -headaches or migraines -leg, arm or groin pain -loss of balance or coordination -severe or sudden headaches -stomach pain (severe) -sudden shortness of breath -sudden numbness or weakness of the face, arm or leg -symptoms of vaginal infection like itching, irritation or unusual discharge -tenderness in the upper abdomen -trouble speaking or understanding -vomiting -yellowing of the eyes or skin Side effects that usually do not require medical attention (report to your doctor or health care professional if they continue or are bothersome): -breakthrough bleeding and spotting that continues beyond the 3 initial cycles of pills -breast tenderness -mood changes, anxiety, depression, frustration, anger, or emotional outbursts -increased sensitivity to sun or ultraviolet light -nausea -skin rash, acne, or brown spots on the skin -weight gain (slight) This list may not describe all possible side effects. Call your doctor for medical advice about side effects. You may report side effects to FDA at 1-800-FDA-1088. Where should I keep my medicine? Keep out of the reach of children. Store at room temperature between 15 and  30 degrees C  (59 and 86 degrees F). Throw away any unused medicine after the expiration date. NOTE: This sheet is a summary. It may not cover all possible information. If you have questions about this medicine, talk to your doctor, pharmacist, or health care provider.  2018 Elsevier/Gold Standard (2015-09-25 08:04:41)

## 2016-07-30 NOTE — Progress Notes (Signed)
HPI:      [name] is a 41 y.o. [id number] who LMP was Patient's last menstrual period was 09/03/2014., she presents today for her annual examination. The patient has no complaints today. The patient is sexually active. Her last pap: approximate date 2017 and was normal and 2016 ASCUS w HPV POs. The patient does perform self breast exams.  There is no notable family history of breast or ovarian cancer in her family.  The patient has regular exercise: yes.  The patient denies current symptoms of depression.  Breast feeding, freq rash around areola.  GYN History: Contraception: oral progesterone-only contraceptive  PMHx: Past Medical History:  Diagnosis Date  . Anxiety   . 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 Depression   . Heart murmur   . Panic attacks   . PVC's (premature ventricular contractions)   . SBE (subacute bacterial endocarditis) prophylaxis candidate    Past Surgical History:  Procedure Laterality Date  . DILATION AND EVACUATION N/A 12/31/2012   Procedure: DILATATION AND EVACUATION;  Surgeon: Tilda BurrowJohn V Ferguson, MD;  Location: [facility];  Service: Gynecology;  Laterality: N/A;  . ovarian cyst removed     L side    Family History  Problem Relation Age of Onset  . Hypertension Mother   . Uterine cancer Mother   . Squamous cell carcinoma Mother   . Breast cancer Paternal Grandmother    Social History  Substance Use Topics  . Smoking status: Former Games developermoker  . Smokeless tobacco: Never Used     Comment: smoked 1/2 ppd for 5 years; quit in 2005   . Alcohol use No     Comment: rare    Current Outpatient Prescriptions:  .  Cholecalciferol (VITAMIN D3) 2000 UNITS capsule, Take 2,000 Units by mouth daily.  , Disp: , Rfl:  .  cyclobenzaprine (FLEXERIL) 5 MG tablet, Take 1 tablet (5 mg total) by mouth 3 (three) times daily as needed for muscle spasms., Disp: 30 tablet, Rfl: 1 .  EPINEPHrine (EPIPEN  2-PAK) 0.3 mg/0.3 mL IJ SOAJ injection, Inject 0.3 mLs (0.3 mg total) into the muscle once., Disp: 1 Device, Rfl: 0 .  fluconazole (DIFLUCAN) 150 MG tablet, Take 1 tablet (150 mg total) by mouth once a week., Disp: 4 tablet, Rfl: 5 .  ibuprofen (ADVIL,MOTRIN) 600 MG tablet, Take 1 tablet (600 mg total) by mouth every 6 (six) hours as needed for mild pain, moderate pain or cramping., Disp: 30 tablet, Rfl: 0 .  Norethindrone-Ethinyl Estradiol-Fe Biphas (LO LOESTRIN FE) 1 MG-10 MCG / 10 MCG tablet, Take 1 tablet by mouth daily., Disp: 84 tablet, Rfl: 3 .  Prenatal Vit-Fe Fumarate-FA (MULTIVITAMIN-PRENATAL) 27-0.8 MG TABS tablet, Take 1 tablet by mouth daily at 12 noon., Disp: , Rfl:  .  sertraline (ZOLOFT) 50 MG tablet, Take 1 tablet (50 mg total) by mouth at bedtime., Disp: 30 tablet, Rfl: 11 Allergies: Escitalopram oxalate and Pork-derived products  Review of Systems  Constitutional: Negative for chills, fever and malaise/fatigue.  HENT: Negative for congestion, sinus pain and sore throat.   Eyes: Negative for blurred vision and pain.  Respiratory: Negative for cough and wheezing.   Cardiovascular: Negative for chest pain and leg swelling.  Gastrointestinal: Negative for abdominal pain, constipation, diarrhea, heartburn, nausea and vomiting.  Genitourinary: Negative for dysuria, frequency, hematuria and urgency.  Musculoskeletal: Negative for back pain, joint pain, myalgias and neck pain.  Skin: Negative for itching and rash.  Neurological: Negative for dizziness, tremors and weakness.  Endo/Heme/Allergies: Does not bruise/bleed easily.  Psychiatric/Behavioral: Negative for depression. The patient is not nervous/anxious and does not have insomnia.     Objective: BP 120/80   Pulse 86   Ht 5\' 10"  (1.778 m)   Wt 149 lb (67.6 kg)   LMP 09/03/2014   BMI 21.38 kg/m   Filed Weights   07/30/16 1055  Weight: 149 lb (67.6 kg)   Body mass index is 21.38 kg/m. Physical Exam  Constitutional:  She is oriented to person, place, and time. She appears well-developed and well-nourished. No distress.  Genitourinary: Rectum normal, vagina normal and uterus normal. Pelvic exam was performed with patient supine. There is no rash or lesion on the right labia. There is no rash or lesion on the left labia. Vagina exhibits no lesion. No bleeding in the vagina. Right adnexum does not display mass and does not display tenderness. Left adnexum does not display mass and does not display tenderness. Cervix does not exhibit motion tenderness, lesion, friability or polyp.   Uterus is mobile and midaxial. Uterus is not enlarged or exhibiting a mass.  HENT:  Head: Normocephalic and atraumatic. Head is without laceration.  Right Ear: Hearing normal.  Left Ear: Hearing normal.  Nose: No epistaxis.  No foreign bodies.  Mouth/Throat: Uvula is midline, oropharynx is clear and moist and mucous membranes are normal.  Eyes: Pupils are equal, round, and reactive to light.  Neck: Normal range of motion. Neck supple. No thyromegaly present.  Cardiovascular: Normal rate and regular rhythm.  Exam reveals no gallop and no friction rub.   No murmur heard. Pulmonary/Chest: Effort normal and breath sounds normal. No respiratory distress. She has no wheezes. Right breast exhibits no mass, no skin change and no tenderness. Left breast exhibits no mass, no skin change and no tenderness.  Areola with raised pink rash c/w yeast  Abdominal: Soft. Bowel sounds are normal. She exhibits no distension. There is no tenderness. There is no rebound.  Musculoskeletal: Normal range of motion.  Neurological: She is alert and oriented to person, place, and time. No cranial nerve deficit.  Skin: Skin is warm and dry.  Psychiatric: She has a normal mood and affect. Judgment normal.    Assessment:  ANNUAL EXAM 1. Annual physical exam   2. History of cervical dysplasia   3. Screening for breast cancer   4. Encounter for surveillance of  contraceptive pills      Screening Plan:            1.  Cervical Screening-  Pap smear done today  2. Breast screening- Exam annually and mammogram>40 planned   3. Colonoscopy every 10 years, Hemoccult testing - after age 90  4. Labs managed by PCP  5. Counseling for contraception: oral contraceptives (estrogen/progesterone)  Other:  1. Annual physical exam  2. History of cervical dysplasia - IGP, Aptima HPV  3. Screening for breast cancer - MM DIGITAL SCREENING BILATERAL; Future  4. Encounter for surveillance of contraceptive pills - Change to reg OCP - Norethindrone-Ethinyl Estradiol-Fe Biphas (LO LOESTRIN FE) 1 MG-10 MCG / 10 MCG tablet; Take 1 tablet by mouth daily.  Dispense: 84 tablet; Refill: 3  5. Breast thrush frequently, currently -Diflucan weekly, other options discussed.    F/U  Return in about 1 year (around 07/30/2017) for Annual.  Annamarie Major, MD, [facility], [facility] 07/30/2016  11:43 AM

## 2016-08-06 LAB — IGP, APTIMA HPV
HPV Aptima: NEGATIVE
PAP Smear Comment: 0

## 2016-08-31 ENCOUNTER — Other Ambulatory Visit: Payer: Self-pay | Admitting: Obstetrics & Gynecology

## 2016-08-31 DIAGNOSIS — R928 Other abnormal and inconclusive findings on diagnostic imaging of breast: Secondary | ICD-10-CM

## 2016-09-04 ENCOUNTER — Ambulatory Visit
Admission: RE | Admit: 2016-09-04 | Discharge: 2016-09-04 | Disposition: A | Discharge status: Home / Self Care | Payer: Managed Care, Other (non HMO) | Source: Ambulatory Visit | Attending: Obstetrics & Gynecology | Admitting: Obstetrics & Gynecology

## 2016-09-04 DIAGNOSIS — Z1231 Encounter for screening mammogram for malignant neoplasm of breast: Secondary | ICD-10-CM | POA: Insufficient documentation

## 2016-09-04 DIAGNOSIS — Z1239 Encounter for other screening for malignant neoplasm of breast: Secondary | ICD-10-CM

## 2016-09-05 ENCOUNTER — Other Ambulatory Visit: Payer: Self-pay | Admitting: Obstetrics & Gynecology

## 2016-09-05 DIAGNOSIS — N6489 Other specified disorders of breast: Secondary | ICD-10-CM

## 2016-09-05 DIAGNOSIS — R928 Other abnormal and inconclusive findings on diagnostic imaging of breast: Secondary | ICD-10-CM

## 2016-09-06 ENCOUNTER — Other Ambulatory Visit: Payer: Self-pay | Admitting: Obstetrics & Gynecology

## 2016-09-06 ENCOUNTER — Encounter: Payer: Self-pay | Admitting: Obstetrics & Gynecology

## 2016-09-06 MED ORDER — DESOGESTREL-ETHINYL ESTRADIOL 0.15-0.02/0.01 MG (21/5) PO TABS: 1.0000 | ORAL_TABLET | Freq: Every day | ORAL | 3 refills | Status: DC

## 2016-09-10 ENCOUNTER — Ambulatory Visit
Admission: RE | Admit: 2016-09-10 | Discharge: 2016-09-10 | Disposition: A | Discharge status: Home / Self Care | Payer: Managed Care, Other (non HMO) | Source: Ambulatory Visit | Attending: Obstetrics & Gynecology | Admitting: Obstetrics & Gynecology

## 2016-09-10 DIAGNOSIS — R928 Other abnormal and inconclusive findings on diagnostic imaging of breast: Secondary | ICD-10-CM

## 2016-09-11 ENCOUNTER — Other Ambulatory Visit: Payer: Managed Care, Other (non HMO)

## 2016-09-11 ENCOUNTER — Ambulatory Visit: Payer: Managed Care, Other (non HMO)

## 2016-09-16 ENCOUNTER — Other Ambulatory Visit: Payer: Self-pay | Admitting: Obstetrics & Gynecology

## 2016-09-16 MED ORDER — DESOGESTREL-ETHINYL ESTRADIOL 0.15-0.02/0.01 MG (21/5) PO TABS: 1.0000 | ORAL_TABLET | Freq: Every day | ORAL | 3 refills | Status: DC

## 2016-09-23 ENCOUNTER — Other Ambulatory Visit: Payer: Self-pay | Admitting: Obstetrics & Gynecology

## 2016-09-24 ENCOUNTER — Other Ambulatory Visit: Payer: Self-pay | Admitting: Obstetrics & Gynecology

## 2016-09-24 MED ORDER — DESOGESTREL-ETHINYL ESTRADIOL 0.15-0.02/0.01 MG (21/5) PO TABS: 1.0000 | ORAL_TABLET | Freq: Every day | ORAL | 3 refills | Status: DC

## 2017-04-21 ENCOUNTER — Encounter: Payer: Self-pay | Admitting: Family Medicine

## 2017-04-21 ENCOUNTER — Other Ambulatory Visit: Payer: Self-pay

## 2017-04-21 ENCOUNTER — Ambulatory Visit (INDEPENDENT_AMBULATORY_CARE_PROVIDER_SITE_OTHER): Payer: Managed Care, Other (non HMO) | Admitting: Family Medicine

## 2017-04-21 VITALS — BP 122/72 | HR 82 | Temp 98.2°F | Resp 12 | Ht 70.0 in | Wt 156.0 lb

## 2017-04-21 DIAGNOSIS — M542 Cervicalgia: Secondary | ICD-10-CM

## 2017-04-21 DIAGNOSIS — G8929 Other chronic pain: Secondary | ICD-10-CM | POA: Diagnosis not present

## 2017-04-21 DIAGNOSIS — Z0000 Encounter for general adult medical examination without abnormal findings: Secondary | ICD-10-CM

## 2017-04-21 MED ORDER — MULTI-VITAMIN/MINERALS PO TABS: 1.0000 | ORAL_TABLET | Freq: Every day | ORAL | 0 refills | Status: AC

## 2017-04-21 MED ORDER — CYCLOBENZAPRINE HCL 5 MG PO TABS: 5.0000 mg | ORAL_TABLET | Freq: Three times a day (TID) | ORAL | 1 refills | Status: DC | PRN

## 2017-04-21 MED ORDER — TRAMADOL HCL 50 MG PO TABS: 50.0000 mg | ORAL_TABLET | Freq: Three times a day (TID) | ORAL | 0 refills | Status: DC | PRN

## 2017-04-21 MED ORDER — EPINEPHRINE 0.3 MG/0.3ML IJ SOAJ: 0.3000 mg | Freq: Once | INTRAMUSCULAR | 0 refills | Status: AC
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
## 2017-04-21 NOTE — Patient Instructions (Signed)
I recommend eye visit once a year I recommend dental visit every 6 months Goal is to  Exercise 30 minutes 5 days a week We will send a letter with lab results  F/U 1 year for Physical   

## 2017-04-22 LAB — COMPREHENSIVE METABOLIC PANEL
AG Ratio: 1.6 (calc) (ref 1.0–2.5)
ALT: 14 U/L (ref 6–29)
AST: 17 U/L (ref 10–30)
Albumin: 4.2 g/dL (ref 3.6–5.1)
Alkaline phosphatase (APISO): 46 U/L (ref 33–115)
BUN: 11 mg/dL (ref 7–25)
CO2: 26 mmol/L (ref 20–32)
Calcium: 9.6 mg/dL (ref 8.6–10.2)
Chloride: 103 mmol/L (ref 98–110)
Creat: 0.79 mg/dL (ref 0.50–1.10)
Globulin: 2.7 g/dL (calc) (ref 1.9–3.7)
Glucose, Bld: 90 mg/dL (ref 65–99)
Potassium: 4.3 mmol/L (ref 3.5–5.3)
Sodium: 137 mmol/L (ref 135–146)
Total Bilirubin: 0.8 mg/dL (ref 0.2–1.2)
Total Protein: 6.9 g/dL (ref 6.1–8.1)

## 2017-04-22 LAB — CBC WITH DIFFERENTIAL/PLATELET
Basophils Absolute: 40 cells/uL (ref 0–200)
Basophils Relative: 0.6 %
Eosinophils Absolute: 302 cells/uL (ref 15–500)
Eosinophils Relative: 4.5 %
HCT: 40.2 % (ref 35.0–45.0)
Hemoglobin: 13.7 g/dL (ref 11.7–15.5)
Lymphs Abs: 2358 cells/uL (ref 850–3900)
MCH: 29.7 pg (ref 27.0–33.0)
MCHC: 34.1 g/dL (ref 32.0–36.0)
MCV: 87.2 fL (ref 80.0–100.0)
MPV: 10.5 fL (ref 7.5–12.5)
Monocytes Relative: 9.6 %
Neutro Abs: 3357 cells/uL (ref 1500–7800)
Neutrophils Relative %: 50.1 %
Platelets: 254 10*3/uL (ref 140–400)
RBC: 4.61 10*6/uL (ref 3.80–5.10)
RDW: 11.7 % (ref 11.0–15.0)
Total Lymphocyte: 35.2 %
WBC mixed population: 643 cells/uL (ref 200–950)
WBC: 6.7 10*3/uL (ref 3.8–10.8)

## 2017-04-22 LAB — LIPID PANEL
Cholesterol: 213 mg/dL — ABNORMAL HIGH (ref <200)
HDL: 92 mg/dL (ref >50)
LDL Cholesterol (Calc): 92 mg/dL (calc)
Non-HDL Cholesterol (Calc): 121 mg/dL (calc) (ref <130)
Total CHOL/HDL Ratio: 2.3 (calc) (ref <5.0)
Triglycerides: 191 mg/dL — ABNORMAL HIGH (ref <150)

## 2017-04-24 ENCOUNTER — Encounter: Payer: Self-pay | Admitting: *Deleted

## 2017-05-22 ENCOUNTER — Telehealth: Payer: Self-pay | Admitting: *Deleted

## 2017-05-22 NOTE — Telephone Encounter (Signed)
Received student medical forms [facility] [employer].   Last CPE 04/21/2017.   Noted form requires TB test and vision/ hearing screen. Call placed to patient. States that she will come in next week for labs and screening.   Verbalized that fee may be charged and is per provider prerogative.   Forms routed to provider.

## 2017-05-23 NOTE — Telephone Encounter (Signed)
noted 

## 2017-06-06 ENCOUNTER — Other Ambulatory Visit: Payer: Managed Care, Other (non HMO)

## 2017-06-06 ENCOUNTER — Ambulatory Visit (INDEPENDENT_AMBULATORY_CARE_PROVIDER_SITE_OTHER): Payer: Managed Care, Other (non HMO) | Admitting: *Deleted

## 2017-06-06 DIAGNOSIS — Z0289 Encounter for other administrative examinations: Secondary | ICD-10-CM

## 2017-06-06 DIAGNOSIS — Z111 Encounter for screening for respiratory tuberculosis: Secondary | ICD-10-CM

## 2017-06-06 NOTE — Telephone Encounter (Signed)
Patient returned and form completed.

## 2017-06-08 LAB — QUANTIFERON-TB GOLD PLUS
Mitogen-NIL: >10 IU/mL
NIL: 0.06 IU/mL
QuantiFERON-TB Gold Plus: NEGATIVE
TB1-NIL: <0 IU/mL
TB2-NIL: 0.02 IU/mL

## 2017-08-20 ENCOUNTER — Other Ambulatory Visit: Payer: Self-pay | Admitting: Obstetrics & Gynecology

## 2017-08-22 ENCOUNTER — Ambulatory Visit (INDEPENDENT_AMBULATORY_CARE_PROVIDER_SITE_OTHER): Payer: Managed Care, Other (non HMO) | Admitting: Obstetrics & Gynecology

## 2017-08-22 ENCOUNTER — Encounter: Payer: Self-pay | Admitting: Obstetrics & Gynecology

## 2017-08-22 ENCOUNTER — Other Ambulatory Visit (HOSPITAL_COMMUNITY)
Admission: RE | Admit: 2017-08-22 | Discharge: 2017-08-22 | Disposition: A | Discharge status: Home / Self Care | Payer: Managed Care, Other (non HMO) | Source: Ambulatory Visit | Attending: Obstetrics & Gynecology | Admitting: Obstetrics & Gynecology

## 2017-08-22 VITALS — BP 138/88 | Ht 70.0 in | Wt 152.0 lb

## 2017-08-22 DIAGNOSIS — Z124 Encounter for screening for malignant neoplasm of cervix: Secondary | ICD-10-CM

## 2017-08-22 DIAGNOSIS — Z01419 Encounter for gynecological examination (general) (routine) without abnormal findings: Secondary | ICD-10-CM | POA: Diagnosis not present

## 2017-08-22 DIAGNOSIS — Z0000 Encounter for general adult medical examination without abnormal findings: Secondary | ICD-10-CM

## 2017-08-22 DIAGNOSIS — N9419 Other specified dyspareunia: Secondary | ICD-10-CM | POA: Diagnosis not present

## 2017-08-22 DIAGNOSIS — Z308 Encounter for other contraceptive management: Secondary | ICD-10-CM | POA: Diagnosis not present

## 2017-08-22 MED ORDER — NORETHINDRONE 0.35 MG PO TABS: 1.0000 | ORAL_TABLET | Freq: Every day | ORAL | 1 refills | Status: DC

## 2017-08-22 NOTE — Progress Notes (Signed)
HPI:      [name] is a 42 y.o. [id number] who LMP was Patient's last menstrual period was 07/13/2017., she presents today for her annual examination. The patient has concerns for birth control related to recent elevations in BP while on OCP, and also deep dyspareunia on some occasions. The patient is sexually active. Her last pap: approximate date 2018 and was abnormal: ASCUS, with Neg HPV and last mammogram: approximate date 2018 and was normal. The patient does perform self breast exams.  There is no notable family history of breast or ovarian cancer in her family.  The patient has regular exercise: yes.  The patient denies current symptoms of depression.    GYN History: Contraception: none- stopped when found to have elevated BPs.     130s/80s mostly, occas 140/90.  PMHx: Past Medical History:  Diagnosis Date  . Anxiety   . 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 Depression   . Heart murmur   . Panic attacks   . PVC's (premature ventricular contractions)   . SBE (subacute bacterial endocarditis) prophylaxis candidate    Past Surgical History:  Procedure Laterality Date  . BREAST CYST ASPIRATION Right 2007  . DILATION AND EVACUATION N/A 12/31/2012   Procedure: DILATATION AND EVACUATION;  Surgeon: Tilda Burrow, MD;  Location: [facility];  Service: Gynecology;  Laterality: N/A;  . ovarian cyst removed     L side    Family History  Problem Relation Age of Onset  . Hypertension Mother   . Uterine cancer Mother   . Squamous cell carcinoma Mother   . Breast cancer Paternal Grandmother        31's   Social History   Tobacco Use  . Smoking status: Former Games developer  . Smokeless tobacco: Never Used  . Tobacco comment: smoked 1/2 ppd for 5 years; quit in 2005   Substance Use Topics  . Alcohol use: No    Comment: rare  . Drug use: Not on file    Current Outpatient Medications:  .  Cholecalciferol (VITAMIN  D3) 2000 UNITS capsule, Take 2,000 Units by mouth daily.  , Disp: , Rfl:  .  cyclobenzaprine (FLEXERIL) 5 MG tablet, Take 1 tablet (5 mg total) by mouth 3 (three) times daily as needed for muscle spasms., Disp: 30 tablet, Rfl: 1 .  Multiple Vitamins-Minerals (MULTIVITAMIN WITH MINERALS) tablet, Take 1 tablet by mouth daily., Disp: 30 tablet, Rfl: 0 .  sertraline (ZOLOFT) 50 MG tablet, Take 1 tablet (50 mg total) by mouth at bedtime., Disp: 30 tablet, Rfl: 11 .  traMADol (ULTRAM) 50 MG tablet, Take 1 tablet (50 mg total) by mouth every 8 (eight) hours as needed., Disp: 30 tablet, Rfl: 0 .  norethindrone (MICRONOR,CAMILA,ERRIN) 0.35 MG tablet, Take 1 tablet (0.35 mg total) by mouth daily., Disp: 1 Package, Rfl: 1 Allergies: Escitalopram oxalate and Pork-derived products  Review of Systems  Constitutional: Negative for chills, fever and malaise/fatigue.  HENT: Negative for congestion, sinus pain and sore throat.   Eyes: Negative for blurred vision and pain.  Respiratory: Negative for cough and wheezing.   Cardiovascular: Negative for chest pain and leg swelling.  Gastrointestinal: Negative for abdominal pain, constipation, diarrhea, heartburn, nausea and vomiting.  Genitourinary: Negative for dysuria, frequency, hematuria and urgency.  Musculoskeletal: Negative for back pain, joint pain, myalgias and neck pain.  Skin: Negative for itching and rash.  Neurological: Negative  for dizziness, tremors and weakness.  Endo/Heme/Allergies: Does not bruise/bleed easily.  Psychiatric/Behavioral: Negative for depression. The patient is not nervous/anxious and does not have insomnia.     Objective: BP 138/88   Ht 5\' 10"  (1.778 m)   Wt 152 lb (68.9 kg)   LMP 07/13/2017   BMI 21.81 kg/m   Filed Weights   08/22/17 0902  Weight: 152 lb (68.9 kg)   Body mass index is 21.81 kg/m. Physical Exam  Constitutional: She is oriented to person, place, and time. She appears well-developed and well-nourished. No  distress.  Genitourinary: Rectum normal, vagina normal and uterus normal. Pelvic exam was performed with patient supine. There is no rash or lesion on the right labia. There is no rash or lesion on the left labia. Vagina exhibits no lesion. No bleeding in the vagina. Right adnexum does not display mass and does not display tenderness. Left adnexum does not display mass and does not display tenderness. Cervix does not exhibit motion tenderness, lesion, friability or polyp.   Uterus is mobile and retroverted. Uterus is not enlarged or exhibiting a mass.  HENT:  Head: Normocephalic and atraumatic. Head is without laceration.  Right Ear: Hearing normal.  Left Ear: Hearing normal.  Nose: No epistaxis.  No foreign bodies.  Mouth/Throat: Uvula is midline, oropharynx is clear and moist and mucous membranes are normal.  Eyes: Pupils are equal, round, and reactive to light.  Neck: Normal range of motion. Neck supple. No thyromegaly present.  Cardiovascular: Normal rate and regular rhythm. Exam reveals no gallop and no friction rub.  No murmur heard. Pulmonary/Chest: Effort normal and breath sounds normal. No respiratory distress. She has no wheezes. Right breast exhibits no mass, no skin change and no tenderness. Left breast exhibits no mass, no skin change and no tenderness.  Abdominal: Soft. Bowel sounds are normal. She exhibits no distension. There is no tenderness. There is no rebound.  Musculoskeletal: Normal range of motion.  Neurological: She is alert and oriented to person, place, and time. No cranial nerve deficit.  Skin: Skin is warm and dry.  Psychiatric: She has a normal mood and affect. Judgment normal.  Vitals reviewed.  Assessment:  ANNUAL EXAM 1. Annual physical exam   2. Screening for cervical cancer   3. Encounter for other contraceptive management   4. Dyspareunia due to medical condition in female    Screening Plan:            1.  Cervical Screening-  Pap smear done today.  Prior ASCUS/ HPV Neg.  2. Breast screening- Prefers to defer MMG until done lactating  Last year MMG normal after several retakes due to still lactating then too  3. Colonoscopy every 10 years, Hemoccult testing - after age 25  4. Labs managed by PCP  5. Counseling for contraception: Plan progesterone hormonal pill for one month to assess BP response as well as period/pain response.  Pt needs contraception. All options discussed.  Consider Lap BTL, Paraguard for non-hormonal options.  Progesterone should be OK with HTN, and we will assess after one month to see, then could offer Depo or Mirena or Nexplanon if tolerates and thus have better birth control effectiveness than the pill.  Info given.   6. Dyspareunia due to medical condition in female Consider retroverted uterus, endometriosis, hormonal [place] or Dx Lap if worsens  A additional total of 15 minutes were spent face-to-face with the patient during this encounter and over half of that time dealt with counseling  and coordination of care, regarding above dyspareunia and contraception mgt in the face of HTN.    F/U  Return in about 1 month (around 09/19/2017) for Follow up.  Annamarie MajorPaul Ernan Runkles, MD, Merlinda FrederickFACOG [facility], [facility] 08/22/2017  10:16 AM

## 2017-08-22 NOTE — Patient Instructions (Signed)
What You Need to Know About Female Sterilization Female sterilization is surgery to prevent pregnancy. In this surgery, the fallopian tubes are either blocked or closed off. This prevents eggs from reaching the uterus so that the eggs cannot be fertilized by sperm and you cannot get pregnant. Sterilization is permanent. It should only be done if you are sure that you do not want to be able to have children. What are the sterilization surgery options? There are several kinds of female sterilization surgeries. They include:  Laparoscopic tubal ligation. In this surgery, the fallopian tubes are tied off, sealed with heat, or blocked with a clip, ring, or clamp. A small portion of each fallopian tube may also be removed. This surgery is done through several small cuts (incisions).  Postpartum tubal ligation. This is also called a mini-laparotomy. This surgery is done right after childbirth or 1 or 2 days after childbirth. In this surgery, the fallopian tubes are tied off, sealed with heat, or blocked with a clip, ring, or clamp. A small portion of each fallopian tube may also be removed. The surgery is done through a single incision.  Hysteroscopic sterilization. In this surgery, a tiny, Amirah-like coil is inserted through the cervix and uterus into the fallopian tubes. The coil causes scarring, which blocks the tubes. After the surgery, contraception should be used for 3 months to allow the scar tissue to form completely.  Is sterilization safe? Generally, sterilization is safe. Complications are rare. However, there are risks. They include:  Bleeding.  Infection.  Reaction to medicine used during the procedure.  Injury to surrounding organs.  Failure of the procedure.  How effective is sterilization? Sterilization is nearly 100% effective, but it can fail. Also, the fallopian tubes can grow back together over time. If this happens, you will be able to get pregnant again. Women who have had  this procedure have a higher chance of having an ectopic pregnancy. An ectopic pregnancy is a pregnancy that happens outside of the uterus. This kind of pregnancy is unsuccessful and can lead to serious bleeding if it is not treated. What are the benefits?  It is usually effective for a lifetime.  It is usually safe.  It does not have the drawbacks of other types of birth control: That means: ? Your hormones are not affected. Because of this, your menstrual periods, sexual desire, and sexual performance will not be affected. ? There are no side effects. What are the drawbacks?  If you change your mind and decide that you want to have children, you may not be able to. Sterilization may be reversed, but a reversal is not always successful.  It does not provide protection against STDs (sexually transmitted diseases).  It increases the chance of having an ectopic pregnancy. This information is not intended to replace advice given to you by your health care provider. Make sure you discuss any questions you have with your health care provider. Document Released: 07/03/2007 Document Revised: 09/07/2015 Document Reviewed: 10/11/2014 Elsevier Interactive Patient Education  2018 [employer].  Medroxyprogesterone injection [Contraceptive] What is this medicine? MEDROXYPROGESTERONE (me DROX ee proe JES te rone) contraceptive injections prevent pregnancy. They provide effective birth control for 3 months. Depo-subQ Provera 104 is also used for treating pain related to endometriosis. This medicine may be used for other purposes; ask your health care provider or pharmacist if you have questions. COMMON BRAND NAME(S): Depo-Provera, Depo-subQ Provera 104 What should I tell my health care provider before I take this medicine?  They need to know if you have any of these conditions: -frequently drink alcohol -asthma -blood vessel disease or a history of a blood clot in the lungs or legs -bone disease  such as osteoporosis -breast cancer -diabetes -eating disorder (anorexia nervosa or bulimia) -high blood pressure -HIV infection or AIDS -kidney disease -liver disease -mental depression -migraine -seizures (convulsions) -stroke -tobacco smoker -vaginal bleeding -an unusual or allergic reaction to medroxyprogesterone, other hormones, medicines, foods, dyes, or preservatives -pregnant or trying to get pregnant -breast-feeding How should I use this medicine? Depo-Provera Contraceptive injection is given into a muscle. Depo-subQ Provera 104 injection is given under the skin. These injections are given by a health care professional. You must not be pregnant before getting an injection. The injection is usually given during the first 5 days after the start of a menstrual period or 6 weeks after delivery of a baby. Talk to your pediatrician regarding the use of this medicine in children. Special care may be needed. These injections have been used in female children who have started having menstrual periods. Overdosage: If you think you have taken too much of this medicine contact a poison control center or emergency room at once. NOTE: This medicine is only for you. Do not share this medicine with others. What if I miss a dose? Try not to miss a dose. You must get an injection once every 3 months to maintain birth control. If you cannot keep an appointment, call and reschedule it. If you wait longer than 13 weeks between Depo-Provera contraceptive injections or longer than 14 weeks between Depo-subQ Provera 104 injections, you could get pregnant. Use another method for birth control if you miss your appointment. You may also need a pregnancy test before receiving another injection. What may interact with this medicine? Do not take this medicine with any of the following medications: -bosentan This medicine may also interact with the following medications: -aminoglutethimide -antibiotics or  medicines for infections, especially rifampin, rifabutin, rifapentine, and griseofulvin -aprepitant -barbiturate medicines such as phenobarbital or primidone -bexarotene -carbamazepine -medicines for seizures like ethotoin, felbamate, oxcarbazepine, phenytoin, topiramate -modafinil -St. John's wort This list may not describe all possible interactions. Give your health care provider a list of all the medicines, herbs, non-prescription drugs, or dietary supplements you use. Also tell them if you smoke, drink alcohol, or use illegal drugs. Some items may interact with your medicine. What should I watch for while using this medicine? This drug does not protect you against HIV infection (AIDS) or other sexually transmitted diseases. Use of this product may cause you to lose calcium from your bones. Loss of calcium may cause weak bones (osteoporosis). Only use this product for more than 2 years if other forms of birth control are not right for you. The longer you use this product for birth control the more likely you will be at risk for weak bones. Ask your health care professional how you can keep strong bones. You may have a change in bleeding pattern or irregular periods. Many females stop having periods while taking this drug. If you have received your injections on time, your chance of being pregnant is very low. If you think you may be pregnant, see your health care professional as soon as possible. Tell your health care professional if you want to get pregnant within the next year. The effect of this medicine may last a long time after you get your last injection. What side effects may I notice from receiving this medicine?  Side effects that you should report to your doctor or health care professional as soon as possible: -allergic reactions like skin rash, itching or hives, swelling of the face, lips, or tongue -breast tenderness or discharge -breathing problems -changes in  vision -depression -feeling faint or lightheaded, falls -fever -pain in the abdomen, chest, groin, or leg -problems with balance, talking, walking -unusually weak or tired -yellowing of the eyes or skin Side effects that usually do not require medical attention (report to your doctor or health care professional if they continue or are bothersome): -acne -fluid retention and swelling -headache -irregular periods, spotting, or absent periods -temporary pain, itching, or skin reaction at site where injected -weight gain This list may not describe all possible side effects. Call your doctor for medical advice about side effects. You may report side effects to FDA at 1-800-FDA-1088. Where should I keep my medicine? This does not apply. The injection will be given to you by a health care professional. NOTE: This sheet is a summary. It may not cover all possible information. If you have questions about this medicine, talk to your doctor, pharmacist, or health care provider.  2018 Elsevier/Gold Standard (2008-02-05 18:37:56)

## 2017-08-26 LAB — CYTOLOGY - PAP
Diagnosis: NEGATIVE
HPV: NOT DETECTED

## 2017-09-04 ENCOUNTER — Other Ambulatory Visit: Payer: Self-pay | Admitting: Obstetrics & Gynecology

## 2017-09-11 ENCOUNTER — Other Ambulatory Visit: Payer: Self-pay | Admitting: Obstetrics & Gynecology

## 2017-09-19 ENCOUNTER — Ambulatory Visit: Payer: Managed Care, Other (non HMO) | Admitting: Obstetrics & Gynecology

## 2017-11-07 ENCOUNTER — Ambulatory Visit: Payer: Managed Care, Other (non HMO)

## 2018-03-18 ENCOUNTER — Telehealth: Payer: Self-pay | Admitting: *Deleted

## 2018-03-18 MED ORDER — ALPRAZOLAM 0.5 MG PO TABS: 0.5000 mg | ORAL_TABLET | Freq: Two times a day (BID) | ORAL | 0 refills | Status: DC | PRN

## 2018-03-18 MED ORDER — SERTRALINE HCL 50 MG PO TABS: 50.0000 mg | ORAL_TABLET | Freq: Every day | ORAL | 3 refills | Status: DC

## 2018-03-18 NOTE — Telephone Encounter (Signed)
Call placed to patient. States that she has been trying to taper off Zoloft but has found that the lower dose works well for her. States that she will increase back to 50mg .   Medications pended for approval.
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
## 2018-03-18 NOTE — Telephone Encounter (Signed)
Verify dose of zoloft, I had 50mg  in the chart If she is taking 25mg , needs to increase to 50mg , she can add xanax 0.5mg  BID prn as bridge #30 R 0, needs OV to further discuss

## 2018-05-10 ENCOUNTER — Other Ambulatory Visit: Payer: Self-pay | Admitting: Family Medicine

## 2018-06-25 ENCOUNTER — Other Ambulatory Visit: Payer: Self-pay

## 2018-06-25 ENCOUNTER — Other Ambulatory Visit: Payer: Managed Care, Other (non HMO)

## 2018-06-25 DIAGNOSIS — Z111 Encounter for screening for respiratory tuberculosis: Secondary | ICD-10-CM

## 2018-06-27 LAB — QUANTIFERON-TB GOLD PLUS
Mitogen-NIL: >10 IU/mL
NIL: 0.08 IU/mL
QuantiFERON-TB Gold Plus: NEGATIVE
TB1-NIL: 0.05 IU/mL
TB2-NIL: 0.07 IU/mL

## 2018-09-25 ENCOUNTER — Encounter: Payer: Self-pay | Admitting: Internal Medicine

## 2018-09-25 ENCOUNTER — Other Ambulatory Visit: Payer: Self-pay

## 2018-09-25 ENCOUNTER — Ambulatory Visit (INDEPENDENT_AMBULATORY_CARE_PROVIDER_SITE_OTHER): Payer: Managed Care, Other (non HMO) | Admitting: Internal Medicine

## 2018-09-25 VITALS — BP 138/96 | HR 78 | Ht 69.5 in | Wt 159.0 lb

## 2018-09-25 DIAGNOSIS — I10 Essential (primary) hypertension: Secondary | ICD-10-CM

## 2018-09-25 DIAGNOSIS — Q231 Congenital insufficiency of aortic valve: Secondary | ICD-10-CM

## 2018-09-25 DIAGNOSIS — R079 Chest pain, unspecified: Secondary | ICD-10-CM | POA: Diagnosis not present

## 2018-09-25 MED ORDER — METOPROLOL SUCCINATE ER 25 MG PO TB24: 25.0000 mg | ORAL_TABLET | Freq: Every day | ORAL | 2 refills | Status: DC

## 2018-09-25 NOTE — Patient Instructions (Addendum)
Medication Instructions:  Your physician has recommended you make the following change in your medication:  1- INCREASE Metoprolol succinate to 25 mg by mouth once a day.  If you need a refill on your cardiac medications before your next appointment, please call your pharmacy.   Lab work: - None ordered.  If you have labs (blood work) drawn today and your tests are completely normal, you will receive your results only by: Marland Kitchen MyChart Message (if you have MyChart) OR . A paper copy in the mail If you have any lab test that is abnormal or we need to change your treatment, we will call you to review the results.  Testing/Procedures: Your physician has requested that you have an echocardiogram. Echocardiography is a painless test that uses sound waves to create images of your heart. It provides your doctor with information about the size and shape of your heart and how well your heart's chambers and valves are working. This procedure takes approximately one hour. There are no restrictions for this procedure. You may get an IV, if needed, to receive an ultrasound enhancing agent through to better visualize your heart.    Follow-Up: At [facility], you and your health needs are our priority.  As part of our continuing mission to provide you with exceptional heart care, we have created designated Provider Care Teams.  These Care Teams include your primary Cardiologist (physician) and Advanced Practice Providers (APPs -  Physician Assistants and Nurse Practitioners) who all work together to provide you with the care you need, when you need it. You will need a follow up appointment in 6 months.   Please call our office 2 months in advance (call in December for late February/early March) to schedule this appointment.  You may see DR Harrell Gave END or one of the following Advanced Practice Providers on your designated Care Team:   Murray Hodgkins, NP Christell Faith, PA-C . Marrianne Mood, PA-C    Echocardiogram An echocardiogram is a procedure that uses painless sound waves (ultrasound) to produce an image of the heart. Images from an echocardiogram can provide important information about:  Signs of coronary artery disease (CAD).  Aneurysm detection. An aneurysm is a weak or damaged part of an artery wall that bulges out from the normal force of blood pumping through the body.  Heart size and shape. Changes in the size or shape of the heart can be associated with certain conditions, including heart failure, aneurysm, and CAD.  Heart muscle function.  Heart valve function.  Signs of a past heart attack.  Fluid buildup around the heart.  Thickening of the heart muscle.  A tumor or infectious growth around the heart valves. Tell a health care provider about:  Any allergies you have.  All medicines you are taking, including vitamins, herbs, eye drops, creams, and over-the-counter medicines.  Any blood disorders you have.  Any surgeries you have had.  Any medical conditions you have.  Whether you are pregnant or may be pregnant. What are the risks? Generally, this is a safe procedure. However, problems may occur, including:  Allergic reaction to dye (contrast) that may be used during the procedure. What happens before the procedure? No specific preparation is needed. You may eat and drink normally. What happens during the procedure?   An IV tube may be inserted into one of your veins.  You may receive contrast through this tube. A contrast is an injection that improves the quality of the pictures from your heart.  A gel will be applied to your chest.  A wand-like tool (transducer) will be moved over your chest. The gel will help to transmit the sound waves from the transducer.  The sound waves will harmlessly bounce off of your heart to allow the heart images to be captured in real-time motion. The images will be recorded on a computer. The procedure may vary  among health care providers and hospitals. What happens after the procedure?  You may return to your normal, everyday life, including diet, activities, and medicines, unless your health care provider tells you not to do that. Summary  An echocardiogram is a procedure that uses painless sound waves (ultrasound) to produce an image of the heart.  Images from an echocardiogram can provide important information about the size and shape of your heart, heart muscle function, heart valve function, and fluid buildup around your heart.  You do not need to do anything to prepare before this procedure. You may eat and drink normally.  After the echocardiogram is completed, you may return to your normal, everyday life, unless your health care provider tells you not to do that. This information is not intended to replace advice given to you by your health care provider. Make sure you discuss any questions you have with your health care provider. Document Released: 01/12/2000 Document Revised: 05/07/2018 Document Reviewed: 02/17/2016 Elsevier Patient Education  2020 [employer].

## 2018-09-25 NOTE — Progress Notes (Signed)
New Outpatient Visit Date: 09/25/2018  Referring Provider: Alycia Rossetti, MD [address] [place],  [place] [address]  Chief Complaint: Chest pain, elevated blood pressure, and history of bicuspid aortic valve  HPI:  Nancy Welch is a 43 y.o. female who is being seen today as a self-referral for follow-up of bicuspid aortic valve.  She was previously seen in our office by Dr. Rockey Situ, most recently in 2015. She has a history of suspected bicuspid aortic valve disease, depression, and anxiety.  Today, [name] notes that over the last few months, she has experienced episodic chest discomfort.  It is brief, lasting only a second or two, and is described as a tightness.  Seems to be more pronounced with some exertion as well as stressful situations.  However, she is able to exercise without any difficulty or significant chest pain.  She is also noticed that her blood pressure has been running a little bit high during this time, with readings in the 130's-140's/80's.  She was previously prescribed metoprolol for PVCs, and began taking metoprolol succinate 12.5 mg daily again.  Since beginning this, she has noticed improvement in her chest pain and blood pressure.  She denies shortness of breath, palpitations, lightheadedness, orthopnea, PND, and edema.  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  During her classes, practice echocardiography was performed and she felt as though her aorta might be slightly dilated.  She believes her EF and aortic valve gradient were normal.  She has not had a formal echocardiogram since 2012.  --------------------------------------------------------------------------------------------------  Cardiovascular History & Procedures: Cardiovascular Problems:  Suspected bicuspid aortic valve  Risk Factors:  None  Cath/PCI:  None  CV Surgery:  None  EP Procedures and Devices:  None  Non-Invasive Evaluation(s):  TTE  (02/22/2010): Normal LV size.  LVEF 50-55% with question mild anterior and septal hypokinesis.  Grade 1 diastolic dysfunction.  Probable bicuspid aortic valve without stenosis.  Mild mitral regurgitation.  Mild to moderate tricuspid regurgitation.  Mild left atrial enlargement.  Normal RV size and function.  Recent CV Pertinent Labs: Lab Results  Component Value Date   CHOL 213 (H) 04/21/2017   HDL 92 04/21/2017   LDLCALC 92 04/21/2017   TRIG 191 (H) 04/21/2017   CHOLHDL 2.3 04/21/2017   K 4.3 04/21/2017   K 3.6 06/23/2013   BUN 11 04/21/2017   BUN 7 06/23/2013   CREATININE 0.79 04/21/2017    --------------------------------------------------------------------------------------------------  Past Medical History:  Diagnosis Date  . Anxiety   . Aortic valve disorder    type of valve not specified; bi -valve disorder; echo 2001 in [place], [place]   . Depression   . Heart murmur   . Panic attacks   . PVC's (premature ventricular contractions)   . SBE (subacute bacterial endocarditis) prophylaxis candidate     Past Surgical History:  Procedure Laterality Date  . BREAST CYST ASPIRATION Right 2007  . DILATION AND EVACUATION N/A 12/31/2012   Procedure: DILATATION AND EVACUATION;  Surgeon: Jonnie Kind, MD;  Location: [facility];  Service: Gynecology;  Laterality: N/A;  . ovarian cyst removed     L side     Current Meds  Medication Sig  . ALPRAZolam (XANAX) 0.5 MG tablet Take 1 tablet (0.5 mg total) by mouth 2 (two) times daily as needed for anxiety.  . Multiple Vitamins-Minerals (MULTIVITAMIN WITH MINERALS) tablet Take 1 tablet by mouth daily.  . sertraline (ZOLOFT) 50 MG tablet TAKE 1 TABLET BY  MOUTH EVERYDAY AT BEDTIME  . traMADol (ULTRAM) 50 MG tablet Take 1 tablet (50 mg total) by mouth every 8 (eight) hours as needed.  . [DISCONTINUED] metoprolol succinate (TOPROL-XL) 12.5 mg TB24 24 hr tablet Take 12.5 mg by mouth daily.    Allergies: Escitalopram oxalate and Pork-derived  products  Social History   Tobacco Use  . Smoking status: Former Games developermoker  . Smokeless tobacco: Never Used  . Tobacco comment: smoked 1/2 ppd for 5 years; quit in 2005   Substance Use Topics  . Alcohol use: No    Comment: rare  . Drug use: Never    Family History  Problem Relation Age of Onset  . Hypertension Mother   . Uterine cancer Mother   . Squamous cell carcinoma Mother   . Breast cancer Paternal Grandmother        4840's  . Arrhythmia Sister   . Heart attack Paternal Grandfather     Review of Systems: A 12-system review of systems was performed and was negative except as noted in the HPI.  --------------------------------------------------------------------------------------------------  Physical Exam: BP (!) 138/96 (BP Location: Right Arm, Patient Position: Sitting, Cuff Size: Normal)   Pulse 78   Ht 5' 9.5" (1.765 m)   Wt 159 lb (72.1 kg)   BMI 23.14 kg/m   General: NAD. HEENT: No conjunctival pallor or scleral icterus. Moist mucous membranes. OP clear. Neck: Supple without lymphadenopathy, thyromegaly, JVD, or HJR. No carotid bruit. Lungs: Normal work of breathing. Clear to auscultation bilaterally without wheezes or crackles. Heart: Regular rate and rhythm without murmurs, rubs, or gallops. Non-displaced PMI. Abd: Bowel sounds present. Soft, NT/ND without hepatosplenomegaly Ext: No lower extremity edema. Radial, PT, and DP pulses are 2+ bilaterally Skin: Warm and dry without rash. Neuro: CNIII-XII intact. Strength and fine-touch sensation intact in upper and lower extremities bilaterally. Psych: Normal mood and affect.  EKG: Normal sinus rhythm without abnormality.  Lab Results  Component Value Date   WBC 6.7 04/21/2017   HGB 13.7 04/21/2017   HCT 40.2 04/21/2017   MCV 87.2 04/21/2017   PLT 254 04/21/2017    Lab Results  Component Value Date   NA 137 04/21/2017   K 4.3 04/21/2017   CL 103 04/21/2017   CO2 26 04/21/2017   BUN 11 04/21/2017    CREATININE 0.79 04/21/2017   GLUCOSE 90 04/21/2017   ALT 14 04/21/2017    Lab Results  Component Value Date   CHOL 213 (H) 04/21/2017   HDL 92 04/21/2017   LDLCALC 92 04/21/2017   TRIG 191 (H) 04/21/2017   CHOLHDL 2.3 04/21/2017     --------------------------------------------------------------------------------------------------  ASSESSMENT AND PLAN: Bicuspid aortic valve: Patient is asymptomatic other than occasional brief chest pain, that I do not believe reflects severe aortic stenosis.  No murmur is appreciated on exam today.  I recommend that we repeat an echocardiogram for reevaluation of her bicuspid aortic valve.  This will also allow us to screen for thoracic aortic aneurysm.  I advised her to speak with her children's pediatrician about screening them for congenital heart disease.  Chest pain: Symptoms most consistent with PVCs, which have previously been identified.  Since symptoms improved with addition of metoprolol, we will continue this (albeit at 25 mg daily).  I have a low suspicion for ischemic heart disease.  Hypertension: Patient reports consistently elevated blood pressure readings at home.  Blood pressure is also slightly elevated today.  She has been taking an old prescription for metoprolol succinate 12.5 mg  daily.  We have agreed to increase this to 25 mg daily.  She is scheduled to follow-up with her PCP in 10/2018, at which time blood pressure can be reassessed and annual labs checked.  Follow-up: Return to clinic in 6 months  Yvonne Kendall, MD 09/25/2018 1:23 PM

## 2018-09-30 ENCOUNTER — Ambulatory Visit (INDEPENDENT_AMBULATORY_CARE_PROVIDER_SITE_OTHER): Payer: Self-pay

## 2018-09-30 ENCOUNTER — Other Ambulatory Visit: Payer: Self-pay

## 2018-09-30 DIAGNOSIS — Z23 Encounter for immunization: Secondary | ICD-10-CM

## 2018-09-30 NOTE — Progress Notes (Signed)
Administered flu vaccine in L deltoid.

## 2018-10-15 ENCOUNTER — Other Ambulatory Visit: Payer: Self-pay

## 2018-10-15 ENCOUNTER — Ambulatory Visit (INDEPENDENT_AMBULATORY_CARE_PROVIDER_SITE_OTHER): Payer: Managed Care, Other (non HMO)

## 2018-10-15 DIAGNOSIS — R079 Chest pain, unspecified: Secondary | ICD-10-CM

## 2018-10-15 DIAGNOSIS — Q231 Congenital insufficiency of aortic valve: Secondary | ICD-10-CM | POA: Diagnosis not present

## 2018-10-16 ENCOUNTER — Other Ambulatory Visit (HOSPITAL_COMMUNITY)
Admission: RE | Admit: 2018-10-16 | Discharge: 2018-10-16 | Disposition: A | Discharge status: Home / Self Care | Payer: Managed Care, Other (non HMO) | Source: Ambulatory Visit | Attending: Obstetrics & Gynecology | Admitting: Obstetrics & Gynecology

## 2018-10-16 ENCOUNTER — Other Ambulatory Visit: Payer: Self-pay

## 2018-10-16 ENCOUNTER — Encounter: Payer: Self-pay | Admitting: Women's Health

## 2018-10-16 ENCOUNTER — Ambulatory Visit (INDEPENDENT_AMBULATORY_CARE_PROVIDER_SITE_OTHER): Payer: Managed Care, Other (non HMO) | Admitting: Women's Health

## 2018-10-16 VITALS — BP 132/98 | HR 73 | Ht 70.0 in | Wt 156.0 lb

## 2018-10-16 DIAGNOSIS — N923 Ovulation bleeding: Secondary | ICD-10-CM

## 2018-10-16 DIAGNOSIS — Z01419 Encounter for gynecological examination (general) (routine) without abnormal findings: Secondary | ICD-10-CM | POA: Insufficient documentation

## 2018-10-16 DIAGNOSIS — N898 Other specified noninflammatory disorders of vagina: Secondary | ICD-10-CM

## 2018-10-16 DIAGNOSIS — N941 Unspecified dyspareunia: Secondary | ICD-10-CM

## 2018-10-16 DIAGNOSIS — N920 Excessive and frequent menstruation with regular cycle: Secondary | ICD-10-CM

## 2018-10-16 NOTE — Progress Notes (Signed)
   WELL-WOMAN EXAMINATION Patient name: Nancy Welch MRN [id number]  Date of birth: 1976/01/21 Chief Complaint:   Gynecologic Exam (pap/physcial)  History of Present Illness:   Nancy Welch is a 43 y.o. [phone] Caucasian female being seen today for a routine well-woman exam.  Current complaints: spotting/cramping in between periods, pain w/ deep penetration, spotting w/ sex, vaginal odor x few months.   PCP: Waynette Buttery      does not desire labs, will do w/ PCP Patient's last menstrual period was 09/21/2018. The current method of family planning is condoms.  Last pap 08/22/17. 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 Results were: normal. Family h/o breast cancer: Yes PGM Last colonoscopy: never. Results were: n/a. Family h/o colorectal cancer: No Review of Systems:   Pertinent items are noted in HPI Denies any headaches, blurred vision, fatigue, shortness of breath, chest pain, abdominal pain, abnormal vaginal discharge/itching/odor/irritation, problems with periods, bowel movements, urination, or intercourse unless otherwise stated above. Pertinent History Reviewed:  Reviewed past medical,surgical, social and family history.  Reviewed problem list, medications and allergies. Physical Assessment:   Vitals:   10/16/18 0932  BP: (!) 132/98  Pulse: 73  Weight: 156 lb (70.8 kg)  Height: 5\' 10"  (1.778 m)  Body mass index is 22.38 kg/m.        Physical Examination:   General appearance - well appearing, and in no distress  Mental status - alert, oriented to person, place, and time  Psych:  She has a normal mood and affect  Skin - warm and dry, normal color, no suspicious lesions noted  Chest - effort normal, all lung fields clear to auscultation bilaterally  Heart - normal rate and regular rhythm  Neck:  midline trachea, no thyromegaly or nodules  Breasts - breasts appear normal, no suspicious masses, no skin or nipple changes or   axillary nodes  Abdomen - soft, nontender, nondistended, no masses or organomegaly  Pelvic - VULVA: normal appearing vulva with no masses, tenderness or lesions  VAGINA: normal appearing vagina with normal color and discharge, no lesions  CERVIX: normal appearing cervix without discharge or lesions, no CMT, friable  Thin prep pap is done w/ HR HPV cotesting  UTERUS: uterus is felt to be normal size, shape, consistency and nontender   ADNEXA: No adnexal masses or tenderness noted.  Extremities:  No swelling or varicosities noted  No results found for this or any previous visit (from the past 24 hour(s)).  Assessment & Plan:  1) Well-Woman Exam  2) Intermenstrual spotting/cramping, dyspareunia w/ deep penetration, vaginal odor> nuswab collected, if neg discussed pelvic ultrasound  3) Past due for screening mammogram  Labs/procedures today: pap, nuswab  Mammogram- schedule screening now Colonoscopy @43yo  or sooner if problems  Orders Placed This Encounter  Procedures  . NuSwab Vaginitis Plus (VG+)    Meds: No orders of the defined types were placed in this encounter.   Follow-up: Return in about 1 year (around 10/16/2019) for Physical.  Sabana Eneas, WHNP-BC 10/16/2018 10:06 AM

## 2018-10-20 LAB — NUSWAB VAGINITIS PLUS (VG+)
Candida albicans, NAA: NEGATIVE
Candida glabrata, NAA: NEGATIVE
Chlamydia trachomatis, NAA: NEGATIVE
Neisseria gonorrhoeae, NAA: NEGATIVE
Trich vag by NAA: NEGATIVE

## 2018-10-21 LAB — CYTOLOGY - PAP
Diagnosis: NEGATIVE
High risk HPV: NEGATIVE
Molecular Disclaimer: 56
Molecular Disclaimer: DETECTED
Molecular Disclaimer: NORMAL

## 2018-10-23 ENCOUNTER — Other Ambulatory Visit: Payer: Self-pay | Admitting: Women's Health

## 2018-10-23 DIAGNOSIS — N926 Irregular menstruation, unspecified: Secondary | ICD-10-CM

## 2018-10-26 ENCOUNTER — Telehealth: Payer: Self-pay | Admitting: Internal Medicine

## 2018-10-26 ENCOUNTER — Ambulatory Visit (INDEPENDENT_AMBULATORY_CARE_PROVIDER_SITE_OTHER): Payer: Managed Care, Other (non HMO) | Admitting: Internal Medicine

## 2018-10-26 ENCOUNTER — Other Ambulatory Visit: Payer: Self-pay

## 2018-10-26 ENCOUNTER — Encounter: Payer: Self-pay | Admitting: Internal Medicine

## 2018-10-26 ENCOUNTER — Ambulatory Visit (INDEPENDENT_AMBULATORY_CARE_PROVIDER_SITE_OTHER): Payer: Managed Care, Other (non HMO)

## 2018-10-26 VITALS — BP 124/90 | HR 73 | Temp 97.8°F | Ht 69.5 in | Wt 157.2 lb

## 2018-10-26 DIAGNOSIS — Z01810 Encounter for preprocedural cardiovascular examination: Secondary | ICD-10-CM | POA: Diagnosis not present

## 2018-10-26 DIAGNOSIS — I10 Essential (primary) hypertension: Secondary | ICD-10-CM

## 2018-10-26 DIAGNOSIS — I712 Thoracic aortic aneurysm, without rupture, unspecified: Secondary | ICD-10-CM

## 2018-10-26 DIAGNOSIS — N926 Irregular menstruation, unspecified: Secondary | ICD-10-CM | POA: Diagnosis not present

## 2018-10-26 DIAGNOSIS — R0789 Other chest pain: Secondary | ICD-10-CM | POA: Diagnosis not present

## 2018-10-26 DIAGNOSIS — I493 Ventricular premature depolarization: Secondary | ICD-10-CM | POA: Diagnosis not present

## 2018-10-26 DIAGNOSIS — Q231 Congenital insufficiency of aortic valve: Secondary | ICD-10-CM | POA: Diagnosis not present

## 2018-10-26 MED ORDER — DIAZEPAM 2 MG PO TABS: 2.0000 mg | ORAL_TABLET | Freq: Once | ORAL | 0 refills | Status: DC

## 2018-10-26 NOTE — Progress Notes (Signed)
PELVIC US TA/TV: homogeneous retroverted uterus,wnl,EEC 5.6 mm,normal ovaries bilat,no free fluid,no pain during ultrasound,ovaries appear mobile,chaperone: Estill Bamberg and [place]

## 2018-10-26 NOTE — Progress Notes (Signed)
Follow-up Outpatient Visit Date: 10/26/2018  Primary Care Provider: Alycia Rossetti, MD [id number] [id number]  Chief Complaint: Chest pain, elevated blood pressure, and follow-up echocardiogram  HPI:  Nancy Welch is a 43 y.o. year-old female with history of suspected bicuspid aortic valve, hypertension, depression, and anxiety, who presents for follow-up of elevated blood pressure and recent echocardiogram.  I met her a month ago for evaluation of chest pain and elevated blood pressure.  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  We agreed to increase metoprolol succinate to 25 mg daily, as it was felt that her atypical chest pain was the result of symptomatic PVCs that had previously been identified.  Echocardiogram showed normal LVEF and wall motion.  Aortic valve morphology could not be accurately assessed.  Report indicates normal sized aorta, though the patient contacted [place] concerned that she observed measurements being made during the echocardiogram suggestive of a dilated aorta.  Today, Nancy Welch reports that she is still having "stable angina."  She feels brief sharp chest pains that last a second or two.  There are no clear precipitants, including exertion.  She may have noticed slight improvement with escalation of metoprolol.  Home blood pressures are typically 120-130/80-90.  She notes slight worsening in fatigue with higher dose of metoprolol.  She has not had any lightheadedness or dizziness.  She is trying to follow a low-sodium diet.  She denies orthopnea, PND, and edema.  --------------------------------------------------------------------------------------------------  Cardiovascular History & Procedures: Cardiovascular Problems:  Suspected bicuspid aortic valve  Atypical chest pain  Risk Factors:  None  Cath/PCI:  None  CV Surgery:  None   EP Procedures and Devices:  None  Non-Invasive Evaluation(s):  TTE (10/15/2018): Normal LV size and wall thickness.  LVEF 60-65% with normal wall motion and diastolic function.  Normal RV size and function.  Normal PA pressure.  Mild tricuspid regurgitation.  Unable to determine number of aortic valve leaflets.  No evidence of stenosis or regurgitation.  Aortic root and ascending aorta are normal, with ascending aorta measuring 3.0 cm.  TTE (02/22/2010): Normal LV size.  LVEF 50-55% with question mild anterior and septal hypokinesis.  Grade 1 diastolic dysfunction.  Probable bicuspid aortic valve without stenosis.  Mild mitral regurgitation.  Mild to moderate tricuspid regurgitation.  Mild left atrial enlargement.  Normal RV size and function.  Recent CV Pertinent Labs: Lab Results  Component Value Date   CHOL 213 (H) 04/21/2017   HDL 92 04/21/2017   LDLCALC 92 04/21/2017   TRIG 191 (H) 04/21/2017   CHOLHDL 2.3 04/21/2017   K 4.3 04/21/2017   K 3.6 06/23/2013   BUN 11 04/21/2017   BUN 7 06/23/2013   CREATININE 0.79 04/21/2017    Past medical and surgical history were reviewed and updated in EPIC.  Current Meds  Medication Sig  . ALPRAZolam (XANAX) 0.5 MG tablet Take 1 tablet (0.5 mg total) by mouth 2 (two) times daily as needed for anxiety.  . metoprolol succinate (TOPROL XL) 25 MG 24 hr tablet Take 1 tablet (25 mg total) by mouth daily.  . Multiple Vitamins-Minerals (MULTIVITAMIN WITH MINERALS) tablet Take 1 tablet by mouth daily.  . sertraline (ZOLOFT) 50 MG tablet TAKE 1 TABLET BY MOUTH EVERYDAY AT BEDTIME  . traMADol (ULTRAM) 50 MG tablet Take 1 tablet (50 mg total) by mouth every 8 (eight) hours as needed.  Allergies: Escitalopram oxalate and Pork-derived products  Social History   Tobacco Use  . Smoking status: Former Research scientist (life sciences)  . Smokeless tobacco: Never Used  . Tobacco comment: smoked 1/2 ppd for 5 years; quit in 2005   Substance Use Topics  . Alcohol use: Yes     Comment: rare  . Drug use: Never    Family History  Problem Relation Age of Onset  . Hypertension Mother   . Uterine cancer Mother   . Squamous cell carcinoma Mother   . Breast cancer Paternal Grandmother        35's  . Arrhythmia Sister   . Lupus Sister   . Rheum arthritis Sister   . Heart attack Paternal Grandfather     Review of Systems: A 12-system review of systems was performed and was negative except as noted in the HPI.  --------------------------------------------------------------------------------------------------  Physical Exam: BP 124/90 (BP Location: Right Arm, Patient Position: Sitting, Cuff Size: Normal)   Pulse 73   Temp 97.8 F (36.6 C)   Ht 5' 9.5" (1.765 m)   Wt 157 lb 4 oz (71.3 kg)   BMI 22.89 kg/m    Repeat BP: 128/88  General:  NAD HEENT: No conjunctival pallor or scleral icterus.  Neck: Supple without lymphadenopathy, thyromegaly, JVD, or HJR. Lungs: Normal work of breathing. Clear to auscultation bilaterally without wheezes or crackles. Heart: Regular rate and rhythm without murmurs, rubs, or gallops. Non-displaced PMI. Abd: Bowel sounds present. Soft, NT/ND without hepatosplenomegaly Ext: No lower extremity edema.  EKG:  Normal sinus rhythm without abnormality.  Lab Results  Component Value Date   WBC 6.7 04/21/2017   HGB 13.7 04/21/2017   HCT 40.2 04/21/2017   MCV 87.2 04/21/2017   PLT 254 04/21/2017    Lab Results  Component Value Date   NA 137 04/21/2017   K 4.3 04/21/2017   CL 103 04/21/2017   CO2 26 04/21/2017   BUN 11 04/21/2017   CREATININE 0.79 04/21/2017   GLUCOSE 90 04/21/2017   ALT 14 04/21/2017    Lab Results  Component Value Date   CHOL 213 (H) 04/21/2017   HDL 92 04/21/2017   LDLCALC 92 04/21/2017   TRIG 191 (H) 04/21/2017   CHOLHDL 2.3 04/21/2017    --------------------------------------------------------------------------------------------------  ASSESSMENT AND PLAN: Bicuspid aortic valve and  suspected thoracic aortic aneurysm: Exam morphology of aortic valve could not be determined from recent echo, though congenitally malformed valve is suspected.  Aortic root and proximal ascending aorta appear to be normal in size.  However, some images show mild dilation of the mid/distal ascending aorta, though the segments are suboptimally imaged due to artifact.  We have discussed options for further evaluation and have agreed to perform a cardiac MRI to include the entire thoracic aorta to exclude aneurysm.  Due to some claustrophobia, I provided Nancy Welch with a prescription for diazepam 2 mg to be taken before the MRI (dose may be repeated if necessary).  She was advised to bring a driver with her.  Chest pain and PVCs: Pain remains atypical and most likely due to intermittent PVCs.  Symptoms have improved with escalation of metoprolol.  No further medication changes for now.  Hypertension: Blood pressure borderline today with a diastolic reading of 90.  We will defer medication changes today and continue to work on sodium restriction.  Follow-up: Return to clinic in 4 to 6 weeks.  Nelva Bush, MD 10/26/2018 2:09 PM

## 2018-10-26 NOTE — Patient Instructions (Addendum)
Medication Instructions:  Your physician recommends that you continue on your current medications as directed. Please refer to the Current Medication list given to you today.  Pre-procedure Valium - Take Valium 1 tablet (2 mg) by mouth once prior to procedure when instructed to do so my medical staff at the hospital. Please make sure you have someone to drive you as you will not be able to drive once you have taken the Valium. You may repeat the dose x1 if instructed by the staff to do so.   If you need a refill on your cardiac medications before your next appointment, please call your pharmacy.   Lab work: Your physician recommends that you return for lab work in: [place] - BMET.  If you have labs (blood work) drawn today and your tests are completely normal, you will receive your results only by: Marland Kitchen MyChart Message (if you have MyChart) OR . A paper copy in the mail If you have any lab test that is abnormal or we need to change your treatment, we will call you to review the results.  Testing/Procedures: Your physician has requested that you have a cardiac MRI. Cardiac MRI uses a computer to create images of your heart as its beating, producing both still and moving pictures of your heart and major blood vessels. For further information please visit [email or web address] Please follow the instruction sheet given to you today for more information.  LOCATION: [facility] Main Entrance A. DATE/TIME: November 16, 2018 (Friday), Arrive at 07:30 am.   Follow-Up: At [facility], you and your health needs are our priority.  As part of our continuing mission to provide you with exceptional heart care, we have created designated Provider Care Teams.  These Care Teams include your primary Cardiologist (physician) and Advanced Practice Providers (APPs -  Physician Assistants and Nurse Practitioners) who all work together to provide you with the care you need, when you need it. You  will need a follow up appointment in 4-6 weeks.  You may see DR Harrell Gave END or one of the following Advanced Practice Providers on your designated Care Team:   Murray Hodgkins, NP Christell Faith, PA-C . Marrianne Mood, PA-C   Magnetic Resonance Imaging Magnetic resonance imaging (MRI) is a painless test that takes pictures of the inside of your body. This test uses a strong magnet. This test does not use X-rays. What happens before the procedure?  You will be asked about any metal you may have in your body. This includes: ? Any joint replacement (prosthesis), such as an artificial knee or hip. ? Any implanted devices or ports. ? A metallic ear implant (cochlear implant). ? An artificial heart valve. ? A metallic object in the eye. ? Metal splinters. ? Bullet fragments. ? Any tattoos. Some of the darker inks can cause problems with testing.  You will be asked to take off all metal. This includes: ? Your watch, jewelry, and other metal objects. ? Hearing aids. ? Dentures. ? Underwire bra. ? Makeup. Some kinds of makeup contain small amounts of metal. ? Braces and fillings normally are not a problem.  If you are breastfeeding, ask your doctor if you need to pump before your test and then stop breastfeeding for a short time. You may need to do this if dye (contrast material) will be used during your MRI.  If you have a fear of cramped spaces (claustrophobia), you may be given medicines prior to the MRI. What happens during  the procedure?   You may be given earplugs or headphones to listen to music. The MRI machine can be noisy.  You will lie down on a platform. It looks like a long table.  If dye will be used, an IV tube will be placed into one of your veins. Dye will be given through your IV tube at a certain time as images are taken.  The platform will slide into a tunnel. The tunnel has magnets inside of it. When you are inside the tunnel, you will still be able to talk to  your doctor.  The tunnel will scan your body and make images. You will be asked to lie very still. Your doctor will tell you when you can move. You may have to wait a few minutes to make sure that the images from the test are clear.  When all images are taken, the platform will slide out of the tunnel. The procedure may vary among doctors and hospitals. What happens after the procedure?  You may be taken to a recovery area if sedation medicines were used. Your blood pressure, heart rate, breathing rate, and blood oxygen level will be monitored until you leave the hospital or clinic.  If dye was used: ? It will leave your body through your pee (urine). It takes about a day for all of the dye to leave the body. ? You may be told to drink plenty of fluids. This helps your body get rid of the dye. ? If you are breastfeeding, do not breastfeed your child until your doctor says that this is safe.  You may go back to your normal activities right away, or as told by your doctor.  It is up to you to get your test results. Ask your doctor, or the department that is doing the test, when your results will be ready. Summary  Magnetic resonance imaging (MRI) is a test that takes pictures of the inside of your body.  Before your MRI, be sure to tell your doctor about any metal you may have in your body.  You may go back to your normal activities right away, or as told by your doctor. This information is not intended to replace advice given to you by your health care provider. Make sure you discuss any questions you have with your health care provider. Document Released: 02/16/2010 Document Revised: 12/09/2016 Document Reviewed: 12/09/2016 Elsevier Patient Education  2020 [employer].

## 2018-10-27 ENCOUNTER — Encounter: Payer: Self-pay | Admitting: Internal Medicine

## 2018-10-27 DIAGNOSIS — I712 Thoracic aortic aneurysm, without rupture, unspecified: Secondary | ICD-10-CM | POA: Insufficient documentation

## 2018-10-27 LAB — BASIC METABOLIC PANEL
BUN/Creatinine Ratio: 14 (ref 9–23)
BUN: 11 mg/dL (ref 6–24)
CO2: 25 mmol/L (ref 20–29)
Calcium: 9.4 mg/dL (ref 8.7–10.2)
Chloride: 103 mmol/L (ref 96–106)
Creatinine, Ser: 0.77 mg/dL (ref 0.57–1.00)
GFR calc Af Amer: 109 mL/min/{1.73_m2} (ref >59)
GFR calc non Af Amer: 95 mL/min/{1.73_m2} (ref >59)
Glucose: 100 mg/dL — ABNORMAL HIGH (ref 65–99)
Potassium: 4.2 mmol/L (ref 3.5–5.2)
Sodium: 140 mmol/L (ref 134–144)

## 2018-10-29 NOTE — Addendum Note (Signed)
Addended by: Alba Destine on: 10/29/2018 07:41 AM   Modules accepted: Orders

## 2018-11-13 ENCOUNTER — Other Ambulatory Visit (HOSPITAL_COMMUNITY): Payer: Managed Care, Other (non HMO)

## 2018-11-16 ENCOUNTER — Other Ambulatory Visit: Payer: Self-pay

## 2018-11-16 ENCOUNTER — Inpatient Hospital Stay (HOSPITAL_COMMUNITY): Admission: RE | Admit: 2018-11-16 | Payer: Managed Care, Other (non HMO) | Source: Ambulatory Visit

## 2018-11-16 ENCOUNTER — Other Ambulatory Visit (HOSPITAL_COMMUNITY): Payer: Managed Care, Other (non HMO)

## 2018-11-16 ENCOUNTER — Ambulatory Visit (HOSPITAL_COMMUNITY)
Admission: RE | Admit: 2018-11-16 | Discharge: 2018-11-16 | Disposition: A | Discharge status: Home / Self Care | Payer: Managed Care, Other (non HMO) | Source: Ambulatory Visit | Attending: Internal Medicine | Admitting: Internal Medicine

## 2018-11-16 DIAGNOSIS — Q231 Congenital insufficiency of aortic valve: Secondary | ICD-10-CM | POA: Insufficient documentation

## 2018-11-16 DIAGNOSIS — R0789 Other chest pain: Secondary | ICD-10-CM | POA: Insufficient documentation

## 2018-11-16 DIAGNOSIS — I712 Thoracic aortic aneurysm, without rupture, unspecified: Secondary | ICD-10-CM

## 2018-11-16 MED ORDER — GADOBUTROL 1 MMOL/ML IV SOLN
8.0000 mL | Freq: Once | INTRAVENOUS | Status: AC | PRN
  Administered 2018-11-16: 13:00:00 8 mL via INTRAVENOUS

## 2018-11-17 ENCOUNTER — Ambulatory Visit (INDEPENDENT_AMBULATORY_CARE_PROVIDER_SITE_OTHER): Payer: Managed Care, Other (non HMO) | Admitting: Family Medicine

## 2018-11-17 ENCOUNTER — Other Ambulatory Visit: Payer: Self-pay

## 2018-11-17 ENCOUNTER — Encounter: Payer: Self-pay | Admitting: Family Medicine

## 2018-11-17 VITALS — BP 102/64 | HR 78 | Temp 97.9°F | Resp 16 | Ht 69.5 in | Wt 158.0 lb

## 2018-11-17 DIAGNOSIS — Z0000 Encounter for general adult medical examination without abnormal findings: Secondary | ICD-10-CM

## 2018-11-17 DIAGNOSIS — I10 Essential (primary) hypertension: Secondary | ICD-10-CM | POA: Diagnosis not present

## 2018-11-17 DIAGNOSIS — M542 Cervicalgia: Secondary | ICD-10-CM | POA: Diagnosis not present

## 2018-11-17 DIAGNOSIS — G8929 Other chronic pain: Secondary | ICD-10-CM

## 2018-11-17 DIAGNOSIS — N926 Irregular menstruation, unspecified: Secondary | ICD-10-CM

## 2018-11-17 DIAGNOSIS — Z0001 Encounter for general adult medical examination with abnormal findings: Secondary | ICD-10-CM

## 2018-11-17 NOTE — Assessment & Plan Note (Signed)
Refilled prn ultram

## 2018-11-17 NOTE — Patient Instructions (Signed)
F/u 6 MONTHS  

## 2018-11-17 NOTE — Progress Notes (Signed)
   Subjective:    Patient ID: Nancy Welch, female    DOB: 1976-01-14, 43 y.o.   MRN: [id number]  Patient presents for Annual Exam (is fasting)  Pt here for CPE   Pt seen by cardiology for angina episodes, had known bicuspic valve , has had intermittant PVC  She is now on Toprol 25mg  XL BP has also been fluctuating prior to going on to the Toprol.  Dates that her systolically get up into the 140s. She had Cardiac MRi done yesterday   GYN visit in Sept 2020 - menses has bene irregular , PAP Smear    Concern she has some thyroid issues, due to the baove with her heart and her menses   She has been strugling to keep weight down   exercising regulray, watching diet    GAD- taking zoloft concerns.  She is also on alprazolam very sparingly.   Needs Ultram refilled, gets 1 script a year uses this for her chronic neck pain.   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 [place]:  [place]- denies fatigue, fever, weight loss,weakness, recent illness HEENT- denies eye drainage, change in vision, nasal discharge, CVS- denies chest pain, palpitations RESP- denies SOB, cough, wheeze ABD- denies N/V, change in stools, abd pain GU- denies dysuria, hematuria, dribbling, incontinence MSK- denies joint pain, muscle aches, injury Neuro- denies headache, dizziness, syncope, seizure activity       Objective:    BP 102/64   Pulse 78   Temp 97.9 F (36.6 C) (Oral)   Resp 16   Ht 5' 9.5" (1.765 m)   Wt 158 lb (71.7 kg)   LMP 10/17/2018   SpO2 98%   BMI 23.00 kg/m  GEN- NAD, alert and oriented x3 HEENT- PERRL, EOMI, non injected sclera, pink conjunctiva, MMM, oropharynx clear Neck- Supple, no thyromegaly CVS- RRR, no murmur RESP-CTAB ABD-NABS,soft,NT,ND Psych- normal affect and mood  EXT- No edema Pulses- Radial, DP- 2+    Fall/audit C/Depression screen negative     Assessment & Plan:      Problem List Items Addressed This Visit      Unprioritized   Chronic neck pain    Refilled prn ultram      Essential hypertension    Controlled with Toprol       Other Visit Diagnoses    Routine general medical examination at a health care facility    -  Primary   CPE done, fasting labs obtained, Pt seen by GYN, check TSH as well due to cardiac issues and irregular menses, also check FSH.LH   Relevant Orders   CBC with Differential/Platelet   Comprehensive metabolic panel   Lipid panel   Irregular menses       Relevant Orders   TSH   FSH/LH      Note: This dictation was prepared with Dragon dictation along with smaller phrase technology. Any transcriptional errors that result from this process are unintentional.

## 2018-11-17 NOTE — Assessment & Plan Note (Signed)
Controlled with Toprol

## 2018-11-18 LAB — CBC WITH DIFFERENTIAL/PLATELET
Absolute Monocytes: 573 cells/uL (ref 200–950)
Basophils Absolute: 49 cells/uL (ref 0–200)
Basophils Relative: 0.8 %
Eosinophils Absolute: 262 cells/uL (ref 15–500)
Eosinophils Relative: 4.3 %
HCT: 41.1 % (ref 35.0–45.0)
Hemoglobin: 13.6 g/dL (ref 11.7–15.5)
Lymphs Abs: 1873 cells/uL (ref 850–3900)
MCH: 29.8 pg (ref 27.0–33.0)
MCHC: 33.1 g/dL (ref 32.0–36.0)
MCV: 90.1 fL (ref 80.0–100.0)
MPV: 11.1 fL (ref 7.5–12.5)
Monocytes Relative: 9.4 %
Neutro Abs: 3343 cells/uL (ref 1500–7800)
Neutrophils Relative %: 54.8 %
Platelets: 239 10*3/uL (ref 140–400)
RBC: 4.56 10*6/uL (ref 3.80–5.10)
RDW: 11.9 % (ref 11.0–15.0)
Total Lymphocyte: 30.7 %
WBC: 6.1 10*3/uL (ref 3.8–10.8)

## 2018-11-18 LAB — LIPID PANEL
Cholesterol: 223 mg/dL — ABNORMAL HIGH (ref <200)
HDL: 78 mg/dL (ref >=50)
LDL Cholesterol (Calc): 129 mg/dL (calc) — ABNORMAL HIGH
Non-HDL Cholesterol (Calc): 145 mg/dL (calc) — ABNORMAL HIGH (ref <130)
Total CHOL/HDL Ratio: 2.9 (calc) (ref <5.0)
Triglycerides: 66 mg/dL (ref <150)

## 2018-11-18 LAB — COMPREHENSIVE METABOLIC PANEL
AG Ratio: 1.8 (calc) (ref 1.0–2.5)
ALT: 11 U/L (ref 6–29)
AST: 18 U/L (ref 10–30)
Albumin: 4.6 g/dL (ref 3.6–5.1)
Alkaline phosphatase (APISO): 48 U/L (ref 31–125)
BUN: 8 mg/dL (ref 7–25)
CO2: 24 mmol/L (ref 20–32)
Calcium: 9.9 mg/dL (ref 8.6–10.2)
Chloride: 103 mmol/L (ref 98–110)
Creat: 0.72 mg/dL (ref 0.50–1.10)
Globulin: 2.6 g/dL (calc) (ref 1.9–3.7)
Glucose, Bld: 84 mg/dL (ref 65–99)
Potassium: 4.2 mmol/L (ref 3.5–5.3)
Sodium: 137 mmol/L (ref 135–146)
Total Bilirubin: 1.5 mg/dL — ABNORMAL HIGH (ref 0.2–1.2)
Total Protein: 7.2 g/dL (ref 6.1–8.1)

## 2018-11-18 LAB — TSH: TSH: 1.45 mIU/L

## 2018-11-18 LAB — FSH/LH
FSH: 4.3 m[IU]/mL
LH: 4.5 m[IU]/mL

## 2018-11-22 ENCOUNTER — Encounter: Payer: Self-pay | Admitting: Family Medicine

## 2018-11-23 MED ORDER — TRAMADOL HCL 50 MG PO TABS: 50.0000 mg | ORAL_TABLET | Freq: Three times a day (TID) | ORAL | 0 refills | Status: DC | PRN

## 2018-11-23 NOTE — Telephone Encounter (Signed)
Ok to refill??  Last office visit 11/17/2018.  Last refill 04/21/2017.

## 2018-12-10 ENCOUNTER — Encounter: Payer: Self-pay | Admitting: Internal Medicine

## 2018-12-10 ENCOUNTER — Telehealth (INDEPENDENT_AMBULATORY_CARE_PROVIDER_SITE_OTHER): Payer: Managed Care, Other (non HMO) | Admitting: Internal Medicine

## 2018-12-10 ENCOUNTER — Other Ambulatory Visit: Payer: Self-pay

## 2018-12-10 VITALS — Ht 70.0 in | Wt 157.0 lb

## 2018-12-10 DIAGNOSIS — I712 Thoracic aortic aneurysm, without rupture, unspecified: Secondary | ICD-10-CM

## 2018-12-10 DIAGNOSIS — I10 Essential (primary) hypertension: Secondary | ICD-10-CM

## 2018-12-10 DIAGNOSIS — Q231 Congenital insufficiency of aortic valve: Secondary | ICD-10-CM

## 2018-12-10 NOTE — Progress Notes (Signed)
Virtual Visit via Telephone Note   This visit type was conducted due to national recommendations for restrictions regarding the COVID-19 Pandemic (e.g. social distancing) in an effort to limit this patient's exposure and mitigate transmission in our community.  Due to her co-morbid illnesses, this patient is at least at moderate risk for complications without adequate follow up.  This format is felt to be most appropriate for this patient at this time.  The patient did not have access to video technology/had technical difficulties with video requiring transitioning to audio format only (telephone).  All issues noted in this document were discussed and addressed.  No physical exam could be performed with this format.  Please refer to the patient's chart for her  consent to telehealth for [facility].   Date:  12/10/2018   ID:  Nancy Welch, DOB [birth year], MRN [id number]  Patient Location: Home Provider Location: Home  PCP:  Salley Scarlet, MD  Cardiologist:  Yvonne Kendall, MD Electrophysiologist:  None   Evaluation Performed:  Follow-Up Visit  Chief Complaint:  Follow-up bicuspid aortic valve and thoracic aortic aneurysm  History of Present Illness:    Nancy Welch is a 43 y.o. female with history of bicuspid aortic valve, hypertension, depression, and anxiety, presenting for follow-up of aortic valve disease and hypertension.  I last saw her in late September, at which time she reported brief episodes of chest pain lasting a second or two.  Home blood pressures remained borderline elevated.  She complained of increasing fatigue with escalation of metoprolol.  We agreed to obtain a cardiac MRI and defer medication changes.  Today, Nancy Welch reports that she has been feeling well.  Brief episodes of chest discomfort have improved and are less frequent with continued use of metoprolol.  She still notes a little fatigue since being started on metoprolol but states that it is  tolerable.  She denies shortness of breath, palpitations, and edema.  Past Medical History:  Diagnosis Date  . Anxiety   . Aortic valve disorder    type of valve not specified; bi -valve disorder; echo 2001 in [place], [place]   . Depression   . Heart murmur   . Panic attacks   . PVC's (premature ventricular contractions)   . SBE (subacute bacterial endocarditis) prophylaxis candidate    Past Surgical History:  Procedure Laterality Date  . BREAST CYST ASPIRATION Right 2007  . DILATION AND EVACUATION N/A 12/31/2012   Procedure: DILATATION AND EVACUATION;  Surgeon: Tilda Burrow, MD;  Location: [facility];  Service: Gynecology;  Laterality: N/A;  . ovarian cyst removed     L side      Current Meds  Medication Sig  . ALPRAZolam (XANAX) 0.5 MG tablet Take 1 tablet (0.5 mg total) by mouth 2 (two) times daily as needed for anxiety.  . metoprolol succinate (TOPROL XL) 25 MG 24 hr tablet Take 1 tablet (25 mg total) by mouth daily.  . Multiple Vitamins-Minerals (MULTIVITAMIN WITH MINERALS) tablet Take 1 tablet by mouth daily.  . sertraline (ZOLOFT) 50 MG tablet TAKE 1 TABLET BY MOUTH EVERYDAY AT BEDTIME  . traMADol (ULTRAM) 50 MG tablet Take 1 tablet (50 mg total) by mouth every 8 (eight) hours as needed.     Allergies:   Escitalopram oxalate and Pork-derived products   Social History   Tobacco Use  . Smoking status: Former Games developer  . Smokeless tobacco: Never Used  . Tobacco comment: smoked 1/2 ppd for 5 years; quit in 2005  Substance Use Topics  . Alcohol use: Yes    Comment: rare  . Drug use: Never     Family Hx: The patient's family history includes Arrhythmia in her sister; Breast cancer in her paternal grandmother; Heart attack in her paternal grandfather; Hypertension in her mother; Lupus in her sister; Rheum arthritis in her sister; Squamous cell carcinoma in her mother; Uterine cancer in her mother.  ROS:   Please see the history of present illness.   All other systems  reviewed and are negative.   Prior CV studies:   The following studies were reviewed today:    Cardiac MRI (11/16/2018): Normal LV and RV size and function.  LVEF 61%.  Bicuspid aortic valve with no significant regurgitation or stenosis.  Mildly dilated ascending aorta (4.2 cm).  TTE (10/15/2018): Normal LV size and wall thickness.  LVEF 60-65% with normal wall motion and diastolic function.  Normal RV size and function.  Normal PA pressure.  Mild tricuspid regurgitation.  Unable to determine number of aortic valve leaflets.  No evidence of stenosis or regurgitation.  Aortic root and ascending aorta are normal, with ascending aorta measuring 3.0 cm.  TTE (02/22/2010): Normal LV size. LVEF 50-55% with question mild anterior and septal hypokinesis. Grade 1 diastolic dysfunction. Probable bicuspid aortic valve without stenosis. Mild mitral regurgitation. Mild to moderate tricuspid regurgitation. Mild left atrial enlargement. Normal RV size and function.  Labs/Other Tests and Data Reviewed:    EKG:  No ECG reviewed.  Recent Labs: 11/17/2018: ALT 11; BUN 8; Creat 0.72; Hemoglobin 13.6; Platelets 239; Potassium 4.2; Sodium 137; TSH 1.45   Recent Lipid Panel Lab Results  Component Value Date/Time   CHOL 223 (H) 11/17/2018 08:48 AM   TRIG 66 11/17/2018 08:48 AM   HDL 78 11/17/2018 08:48 AM   CHOLHDL 2.9 11/17/2018 08:48 AM   LDLCALC 129 (H) 11/17/2018 08:48 AM    Wt Readings from Last 3 Encounters:  12/10/18 157 lb (71.2 kg)  11/17/18 158 lb (71.7 kg)  10/26/18 157 lb 4 oz (71.3 kg)     Objective:    Vital Signs:  Ht 5\' 10"  (1.778 m)   Wt 157 lb (71.2 kg)   BMI 22.53 kg/m    VITAL SIGNS:  reviewed  ASSESSMENT & PLAN:    Bicuspid aortic valve: Previously suspected by echo and confirmed on cardiac MRI.  No significant stenosis or regurgitation observed.  We will plan to continue with clinical follow and repeat echo in 2-3 years.  Thoracic aortic aneurysm: Mild dilation  of the ascending aorta noted on echo and cardiac MRI (4.2 cm).  We will plan to repeat MRA of the chest in 1 year.  Importance of BP control discussed.  I encouraged Nancy Welch to avoid fluoroquinolone antibiotics if possible in the future.  Hypertension: BP well controlled at recent PCP visit.  Continue metoprolol succinate 25 mg daily.  Time:   Today, I have spent 10 minutes with the patient with telehealth technology discussing the above problems.     Medication Adjustments/Labs and Tests Ordered: Current medicines are reviewed at length with the patient today.  Concerns regarding medicines are outlined above.   Tests Ordered: Orders Placed This Encounter  Procedures  . MR Angiogram Chest W Wo Contrast    Medication Changes: No orders of the defined types were placed in this encounter.   Follow Up:  In Person in 1 year(s)  Signed, Nelva Bush, MD  12/10/2018 10:20 AM    [facility]

## 2018-12-10 NOTE — Patient Instructions (Signed)
Medication Instructions:  Your physician recommends that you continue on your current medications as directed. Please refer to the Current Medication list given to you today.  *If you need a refill on your cardiac medications before your next appointment, please call your pharmacy*  Lab Work: You will need BMET lab work within 30 days of the MRA of the chest that you will have next year.  If you have labs (blood work) drawn today and your tests are completely normal, you will receive your results only by: Marland Kitchen MyChart Message (if you have MyChart) OR . A paper copy in the mail If you have any lab test that is abnormal or we need to change your treatment, we will call you to review the results.  Testing/Procedures: Your physician has requested you have an MRA of the chest in 1 year for re-evaluation of your Thoracic aortic aneurysm. Please call our office next year in September to October time period to set up this MRA.  Follow-Up: At [facility], you and your health needs are our priority.  As part of our continuing mission to provide you with exceptional heart care, we have created designated Provider Care Teams.  These Care Teams include your primary Cardiologist (physician) and Advanced Practice Providers (APPs -  Physician Assistants and Nurse Practitioners) who all work together to provide you with the care you need, when you need it.  Your next appointment:   12 months after you have had the MRA of the chest. When you call to schedule follow up office appointment, please make sure to ask about scheduling the MRA as well.   The format for your next appointment:   In Person  Provider:    You may see DR Harrell Gave END or one of the following Advanced Practice Providers on your designated Care Team:    Murray Hodgkins, NP  Christell Faith, PA-C  Marrianne Mood, PA-C

## 2019-01-06 MED ORDER — METOPROLOL SUCCINATE ER 25 MG PO TB24: 25.0000 mg | ORAL_TABLET | Freq: Two times a day (BID) | ORAL | Status: DC

## 2019-01-11 ENCOUNTER — Other Ambulatory Visit: Payer: Self-pay

## 2019-01-12 MED ORDER — METOPROLOL SUCCINATE ER 25 MG PO TB24: 25.0000 mg | ORAL_TABLET | Freq: Two times a day (BID) | ORAL | 3 refills | Status: DC

## 2019-03-04 ENCOUNTER — Other Ambulatory Visit: Payer: Self-pay | Admitting: *Deleted

## 2019-03-04 MED ORDER — METOPROLOL SUCCINATE ER 25 MG PO TB24: 25.0000 mg | ORAL_TABLET | Freq: Two times a day (BID) | ORAL | 3 refills | Status: DC

## 2019-03-04 NOTE — Telephone Encounter (Signed)
Requested Prescriptions   Signed Prescriptions Disp Refills  . metoprolol succinate (TOPROL XL) 25 MG 24 hr tablet 180 tablet 3    Sig: Take 1 tablet (25 mg total) by mouth 2 (two) times daily.    Authorizing Provider: END, CHRISTOPHER    Ordering User: Kendrick Fries

## 2019-03-24 ENCOUNTER — Telehealth (INDEPENDENT_AMBULATORY_CARE_PROVIDER_SITE_OTHER): Payer: Managed Care, Other (non HMO) | Admitting: Internal Medicine

## 2019-03-24 ENCOUNTER — Other Ambulatory Visit: Payer: Self-pay

## 2019-03-24 DIAGNOSIS — Z5329 Procedure and treatment not carried out because of patient's decision for other reasons: Secondary | ICD-10-CM

## 2019-03-24 NOTE — Progress Notes (Signed)
Erroneous encounter

## 2019-03-25 ENCOUNTER — Encounter: Payer: Self-pay | Admitting: Internal Medicine

## 2019-03-25 ENCOUNTER — Telehealth (INDEPENDENT_AMBULATORY_CARE_PROVIDER_SITE_OTHER): Payer: Managed Care, Other (non HMO) | Admitting: Internal Medicine

## 2019-03-25 ENCOUNTER — Other Ambulatory Visit: Payer: Self-pay

## 2019-03-25 VITALS — BP 138/92 | HR 70 | Ht 70.0 in | Wt 157.0 lb

## 2019-03-25 DIAGNOSIS — I712 Thoracic aortic aneurysm, without rupture, unspecified: Secondary | ICD-10-CM

## 2019-03-25 DIAGNOSIS — Q231 Congenital insufficiency of aortic valve: Secondary | ICD-10-CM

## 2019-03-25 DIAGNOSIS — I10 Essential (primary) hypertension: Secondary | ICD-10-CM | POA: Diagnosis not present

## 2019-03-25 DIAGNOSIS — R0789 Other chest pain: Secondary | ICD-10-CM

## 2019-03-25 MED ORDER — LOSARTAN POTASSIUM 25 MG PO TABS: 25.0000 mg | ORAL_TABLET | Freq: Every day | ORAL | 1 refills | Status: DC

## 2019-03-25 MED ORDER — METOPROLOL SUCCINATE ER 25 MG PO TB24: 25.0000 mg | ORAL_TABLET | Freq: Two times a day (BID) | ORAL | 3 refills | Status: DC

## 2019-03-25 NOTE — Patient Instructions (Signed)
Medication Instructions:  Your physician has recommended you make the following change in your medication:  1- START Losartan 25 mg by mouth once a day.  *If you need a refill on your cardiac medications before your next appointment, please call your pharmacy*  Lab Work: Your physician recommends that you return for lab work in: 2 WEEKS, on or around March 11. You may go on the Saturday after if you would like. - Please go to the [facility]. You will check in at the front desk to the right as you walk into the atrium. Valet Parking is offered if needed. - No appointment needed. You may go any day between 7 am and 6 pm.  If you have labs (blood work) drawn today and your tests are completely normal, you will receive your results only by: Marland Kitchen MyChart Message (if you have MyChart) OR . A paper copy in the mail If you have any lab test that is abnormal or we need to change your treatment, we will call you to review the results.  Testing/Procedures: none  Follow-Up: At [facility], you and your health needs are our priority.  As part of our continuing mission to provide you with exceptional heart care, we have created designated Provider Care Teams.  These Care Teams include your primary Cardiologist (physician) and Advanced Practice Providers (APPs -  Physician Assistants and Nurse Practitioners) who all work together to provide you with the care you need, when you need it.  Your next appointment:   3 month(s)  Scheduled already.  The format for your next appointment:   In Person  Provider:    You may see DR Cristal Deer END or one of the following Advanced Practice Providers on your designated Care Team:    Nicolasa Ducking, NP  Eula Listen, PA-C  Marisue Ivan, PA-C

## 2019-03-25 NOTE — Progress Notes (Signed)
Virtual Visit via Telephone Note   This visit type was conducted due to national recommendations for restrictions regarding the COVID-19 Pandemic (e.g. social distancing) in an effort to limit this patient's exposure and mitigate transmission in our community.  Due to her co-morbid illnesses, this patient is at least at moderate risk for complications without adequate follow up.  This format is felt to be most appropriate for this patient at this time.  The patient did not have access to video technology/had technical difficulties with video requiring transitioning to audio format only (telephone).  All issues noted in this document were discussed and addressed.  No physical exam could be performed with this format.  Please refer to the patient's chart for her  consent to telehealth for [facility].   Date:  03/25/2019   ID:  Nancy Welch, DOB [birth year], MRN [id number]  Patient Location: Home Provider Location: Home  PCP:  Alycia Rossetti, MD  Cardiologist:  Nelva Bush, MD Electrophysiologist:  None   Evaluation Performed:  Follow-Up Visit  Chief Complaint:  Elevated blood pressure  History of Present Illness:    Nancy Welch is a 44 y.o. female with history of bicuspid aortic valve,  thoracic aortic aneurysm, hypertension, depression, and anxiety.  Today, Ms. Fath reports that her blood pressure has remained elevated despite escalation of metoprolol succinate to 25 mg twice daily.  Home blood pressures are usually in the 130-150/80-100 range.  She has been exercising at least 4 days per week but feels like her blood pressure is not improving.  She also notes almost daily chest discomfort that she describes as a dull pressure.  It is migratory and can occur in different locations in her chest and upper back.  The pain usually lasts a few minutes and is associated with stress.  She actually feels better when she exercises and denies chest pain/pressure with exertion.  She also  denies shortness of breath and palpitations.  She is not planning on becoming pregnant.   Past Medical History:  Diagnosis Date  . Anxiety   . Aortic valve disorder    type of valve not specified; bi -valve disorder; echo 2001 in [place], [place]   . Depression   . Heart murmur   . Panic attacks   . PVC's (premature ventricular contractions)   . SBE (subacute bacterial endocarditis) prophylaxis candidate    Past Surgical History:  Procedure Laterality Date  . BREAST CYST ASPIRATION Right 2007  . DILATION AND EVACUATION N/A 12/31/2012   Procedure: DILATATION AND EVACUATION;  Surgeon: Jonnie Kind, MD;  Location: [facility];  Service: Gynecology;  Laterality: N/A;  . ovarian cyst removed     L side      Current Meds  Medication Sig  . ALPRAZolam (XANAX) 0.5 MG tablet Take 1 tablet (0.5 mg total) by mouth 2 (two) times daily as needed for anxiety.  . metoprolol succinate (TOPROL XL) 25 MG 24 hr tablet Take 1 tablet (25 mg total) by mouth 2 (two) times daily.  . Multiple Vitamins-Minerals (MULTIVITAMIN WITH MINERALS) tablet Take 1 tablet by mouth daily.  . sertraline (ZOLOFT) 50 MG tablet TAKE 1 TABLET BY MOUTH EVERYDAY AT BEDTIME  . traMADol (ULTRAM) 50 MG tablet Take 1 tablet (50 mg total) by mouth every 8 (eight) hours as needed.     Allergies:   Escitalopram oxalate and Pork-derived products   Social History   Tobacco Use  . Smoking status: Former Research scientist (life sciences)  . Smokeless tobacco: Never Used  .  Tobacco comment: smoked 1/2 ppd for 5 years; quit in 2005   Substance Use Topics  . Alcohol use: Yes    Comment: rare  . Drug use: Never     Family Hx: The patient's family history includes Arrhythmia in her sister; Breast cancer in her paternal grandmother; Heart attack in her paternal grandfather; Hypertension in her mother; Lupus in her sister; Rheum arthritis in her sister; Squamous cell carcinoma in her mother; Uterine cancer in her mother.  ROS:   Please see the history of  present illness.   All other systems reviewed and are negative.   Prior CV studies:   The following studies were reviewed today:  Cardiac MRI/MRA (11/16/2018): Normal LV size and wall motion.  LVEF 61%.  Normal RV size and systolic function.  Bicuspid aortic valve without significant regurgitation or stenosis.  Mildly dilated ascending aorta (4.2 cm).  Labs/Other Tests and Data Reviewed:    EKG:  No ECG reviewed.  Recent Labs: 11/17/2018: ALT 11; BUN 8; Creat 0.72; Hemoglobin 13.6; Platelets 239; Potassium 4.2; Sodium 137; TSH 1.45   Recent Lipid Panel Lab Results  Component Value Date/Time   CHOL 223 (H) 11/17/2018 08:48 AM   TRIG 66 11/17/2018 08:48 AM   HDL 78 11/17/2018 08:48 AM   CHOLHDL 2.9 11/17/2018 08:48 AM   LDLCALC 129 (H) 11/17/2018 08:48 AM    Wt Readings from Last 3 Encounters:  03/25/19 157 lb (71.2 kg)  12/10/18 157 lb (71.2 kg)  11/17/18 158 lb (71.7 kg)     Objective:    Vital Signs:  BP (!) 138/92   Pulse 70   Ht 5\' 10"  (1.778 m)   Wt 157 lb (71.2 kg)   BMI 22.53 kg/m    VITAL SIGNS:  reviewed  ASSESSMENT & PLAN:    Bicuspid aortic valve and thoracic aortic aneurysm: Ms. Nusser notes more frequent migratory chest pain, which is not consistent with angina or acute aortic syndrome.  We will continue with blood pressure control with plans for follow-up MRA of the chest in the fall.  Hypertension: Blood pressure is suboptimially controlled at home.  We will continue metoprolol succinate 25 mg BID and start losartan 25 mg daily, as Ms. Cookston does not intend to become pregnant.  Potential teratogenic effects of ARB's were discussed.  We will check a BMP in 2 weeks; renal function and potassium were normal in 10/2018.  Chest pain: Atypical and mostly associated with stress.  Lack of symptoms with exertion and migratory quality are reassuring.  We discussed ETT but have agreed to defer this.  Time:   Today, I have spent 16 minutes with the patient  with telehealth technology discussing the above problems.     Medication Adjustments/Labs and Tests Ordered: Current medicines are reviewed at length with the patient today.  Concerns regarding medicines are outlined above.   Tests Ordered: BMP in ~2 weeks.  Medication Changes: Start losartan 25 mg daily.  Follow Up:  In Person in 3 month(s)  Signed, 11/2018, MD  03/25/2019 8:17 AM     [facility]

## 2019-04-13 ENCOUNTER — Other Ambulatory Visit
Admission: RE | Admit: 2019-04-13 | Discharge: 2019-04-13 | Disposition: A | Discharge status: Home / Self Care | Payer: Managed Care, Other (non HMO) | Source: Ambulatory Visit | Attending: Internal Medicine | Admitting: Internal Medicine

## 2019-04-13 DIAGNOSIS — I712 Thoracic aortic aneurysm, without rupture, unspecified: Secondary | ICD-10-CM

## 2019-04-13 DIAGNOSIS — I10 Essential (primary) hypertension: Secondary | ICD-10-CM | POA: Insufficient documentation

## 2019-04-13 DIAGNOSIS — Q231 Congenital insufficiency of aortic valve: Secondary | ICD-10-CM | POA: Diagnosis present

## 2019-04-13 DIAGNOSIS — R0789 Other chest pain: Secondary | ICD-10-CM | POA: Insufficient documentation

## 2019-04-13 LAB — BASIC METABOLIC PANEL
Anion gap: 7 (ref 5–15)
BUN: 13 mg/dL (ref 6–20)
CO2: 28 mmol/L (ref 22–32)
Calcium: 8.9 mg/dL (ref 8.9–10.3)
Chloride: 103 mmol/L (ref 98–111)
Creatinine, Ser: 0.67 mg/dL (ref 0.44–1.00)
GFR calc Af Amer: >60 mL/min (ref >60)
GFR calc non Af Amer: >60 mL/min (ref >60)
Glucose, Bld: 86 mg/dL (ref 70–99)
Potassium: 4 mmol/L (ref 3.5–5.1)
Sodium: 138 mmol/L (ref 135–145)

## 2019-05-31 ENCOUNTER — Other Ambulatory Visit: Payer: Self-pay | Admitting: Family Medicine

## 2019-06-16 ENCOUNTER — Ambulatory Visit: Payer: Managed Care, Other (non HMO) | Admitting: Internal Medicine

## 2019-06-17 ENCOUNTER — Encounter: Payer: Self-pay | Admitting: Internal Medicine

## 2019-06-17 ENCOUNTER — Telehealth (INDEPENDENT_AMBULATORY_CARE_PROVIDER_SITE_OTHER): Payer: Managed Care, Other (non HMO) | Admitting: Internal Medicine

## 2019-06-17 ENCOUNTER — Other Ambulatory Visit: Payer: Self-pay

## 2019-06-17 ENCOUNTER — Telehealth: Payer: Self-pay

## 2019-06-17 VITALS — BP 122/75 | HR 66 | Ht 69.5 in | Wt 160.0 lb

## 2019-06-17 DIAGNOSIS — I712 Thoracic aortic aneurysm, without rupture, unspecified: Secondary | ICD-10-CM

## 2019-06-17 DIAGNOSIS — I10 Essential (primary) hypertension: Secondary | ICD-10-CM | POA: Diagnosis not present

## 2019-06-17 DIAGNOSIS — Q231 Congenital insufficiency of aortic valve: Secondary | ICD-10-CM | POA: Diagnosis not present

## 2019-06-17 NOTE — Patient Instructions (Addendum)
Medication Instructions:  Your physician recommends that you continue on your current medications as directed. Please refer to the Current Medication list given to you today.  *If you need a refill on your cardiac medications before your next appointment, please call your pharmacy*   Lab Work: none If you have labs (blood work) drawn today and your tests are completely normal, you will receive your results only by: Marland Kitchen MyChart Message (if you have MyChart) OR . A paper copy in the mail If you have any lab test that is abnormal or we need to change your treatment, we will call you to review the results.   Testing/Procedures:  You will be due for MRA of the Chest in October 2021.  If you do not hear anything, please call us to schedule prior to your follow up appointment in 6 months.   Follow-Up: At [facility], you and your health needs are our priority.  As part of our continuing mission to provide you with exceptional heart care, we have created designated Provider Care Teams.  These Care Teams include your primary Cardiologist (physician) and Advanced Practice Providers (APPs -  Physician Assistants and Nurse Practitioners) who all work together to provide you with the care you need, when you need it.  We recommend signing up for the patient portal called "MyChart".  Sign up information is provided on this After Visit Summary.  MyChart is used to connect with patients for Virtual Visits (Telemedicine).  Patients are able to view lab/test results, encounter notes, upcoming appointments, etc.  Non-urgent messages can be sent to your provider as well.   To learn more about what you can do with MyChart, go to [email or web address].    Your next appointment:   6 month(s)  The format for your next appointment:   In Person  Provider:    You may see DR Cristal Deer END or one of the following Advanced Practice Providers on your designated Care Team:    Nicolasa Ducking, NP  Eula Listen, PA-C  Marisue Ivan, PA-C

## 2019-06-17 NOTE — Progress Notes (Addendum)
Virtual Visit via Telephone Note   This visit type was conducted due to national recommendations for restrictions regarding the COVID-19 Pandemic (e.g. social distancing) in an effort to limit this patient's exposure and mitigate transmission in our community.  Due to her co-morbid illnesses, this patient is at least at moderate risk for complications without adequate follow up.  This format is felt to be most appropriate for this patient at this time.  The patient did not have access to video technology/had technical difficulties with video requiring transitioning to audio format only (telephone).  All issues noted in this document were discussed and addressed.  No physical exam could be performed with this format.  Please refer to the patient's chart for her  consent to telehealth for [facility].   The patient was identified using 2 identifiers.  Date:  06/17/2019   ID:  Nancy Welch, DOB 1975/03/22, MRN [id number]  Patient Location: Home Provider Location: Home  PCP:  Salley Scarlet, MD  Cardiologist:  Yvonne Kendall, MD Electrophysiologist:  None   Evaluation Performed:  Follow-Up Visit  Chief Complaint:  Follow-up aortic/aortic valve disease and hypertension  History of Present Illness:    Nancy Welch is a 44 y.o. female with of bicuspid aortic valve,hypertension, depression, and anxiety.  We are speaking today for follow-up of hypertension as well as bicuspid aortic valve with mild dilation of the ascending aorta.  We last spoke via virtual visit 3 months ago, at which time Nancy Welch reported upper normal to mildly elevated blood pressures as well as an almost daily migratory dull pressure in her chest.  We agreed to add losartan 25 mg daily to metoprolol.  Today, Nancy Welch reports that she has been doing quite well.  Her blood pressure has been under much better control, most readings similar to today's.  She is tolerating losartan and metoprolol well, other than  occasional numbness in her fingers and toes when sedentary.  She reports minimal episodes of chest pain and palpitations.  She has not had any shortness of breath or lightheadedness.  She continues to exercise 4x/week.  Past Medical History:  Diagnosis Date  . Anxiety   . Aortic valve disorder    type of valve not specified; bi -valve disorder; echo 2001 in [place], [place]   . Depression   . Heart murmur   . Panic attacks   . PVC's (premature ventricular contractions)   . SBE (subacute bacterial endocarditis) prophylaxis candidate    Past Surgical History:  Procedure Laterality Date  . BREAST CYST ASPIRATION Right 2007  . DILATION AND EVACUATION N/A 12/31/2012   Procedure: DILATATION AND EVACUATION;  Surgeon: Tilda Burrow, MD;  Location: [facility];  Service: Gynecology;  Laterality: N/A;  . ovarian cyst removed     L side      Current Meds  Medication Sig  . ALPRAZolam (XANAX) 0.5 MG tablet Take 1 tablet (0.5 mg total) by mouth 2 (two) times daily as needed for anxiety.  Marland Kitchen losartan (COZAAR) 25 MG tablet Take 1 tablet (25 mg total) by mouth daily.  . metoprolol succinate (TOPROL XL) 25 MG 24 hr tablet Take 1 tablet (25 mg total) by mouth 2 (two) times daily.  . Multiple Vitamins-Minerals (MULTIVITAMIN WITH MINERALS) tablet Take 1 tablet by mouth daily.  . sertraline (ZOLOFT) 50 MG tablet TAKE 1 TABLET BY MOUTH EVERYDAY AT BEDTIME     Allergies:   Escitalopram oxalate and Pork-derived products   Social History   Tobacco  Use  . Smoking status: Former Smoker  . Smokeless tobacco: Never Used  . Tobacco comment: smoked 1/2 ppd for 5 years; quit in 2005   Substance Use Topics  . Alcohol use: Yes    Comment: rare  . Drug use: Never     Family Hx: The patient's family history includes Arrhythmia in her sister; Breast cancer in her paternal grandmother; Heart attack in her paternal grandfather; Hypertension in her mother; Lupus in her sister; Rheum arthritis in her sister; Squamous  cell carcinoma in her mother; Uterine cancer in her mother.  ROS:   Please see the history of present illness. All other systems reviewed and are negative.   Prior CV studies:   The following studies were reviewed today:  Cardiac MRI/MRA (11/16/2018): Normal LV size and wall motion.  LVEF 61%.  Normal RV size and systolic function.  Bicuspid aortic valve without significant regurgitation or stenosis.  Mildly dilated ascending aorta (4.2 cm).  TTE (10/15/2018): Normal LV size and wall thickness.  LVEF 60-65%.  Normal RV size and function.  Unable to determine number of aortic valve leaflets.  No AS/AI.  Mild TR.  Labs/Other Tests and Data Reviewed:    EKG:  No ECG reviewed.  Recent Labs: 11/17/2018: ALT 11; Hemoglobin 13.6; Platelets 239; TSH 1.45 04/13/2019: BUN 13; Creatinine, Ser 0.67; Potassium 4.0; Sodium 138   Recent Lipid Panel Lab Results  Component Value Date/Time   CHOL 223 (H) 11/17/2018 08:48 AM   TRIG 66 11/17/2018 08:48 AM   HDL 78 11/17/2018 08:48 AM   CHOLHDL 2.9 11/17/2018 08:48 AM   LDLCALC 129 (H) 11/17/2018 08:48 AM    Wt Readings from Last 3 Encounters:  06/17/19 160 lb (72.6 kg)  03/25/19 157 lb (71.2 kg)  12/10/18 157 lb (71.2 kg)     Objective:    Vital Signs:  BP 122/75 (BP Location: Right Arm, Patient Position: Sitting, Cuff Size: Normal)   Pulse 66   Ht 5' 9.5" (1.765 m)   Wt 160 lb (72.6 kg)   BMI 23.29 kg/m    VITAL SIGNS:  reviewed  ASSESSMENT & PLAN:    Thoracic aortic aneurysm and bicuspid aortic valve: Asymptomatic with good blood pressure control.  Plan for repeat MRA in 10/2019 to ensure stability of TAA.  Hypertension: BP well-controlled on current regimen of metoprolol and losartan.  No medication changes today.  Chest pain and palpitations: Improved from prior visits and minimal.  No further workup or intervention at this time.  Continue low-dose metoprolol.  Time:   Today, I have spent 10 minutes with the patient with  telehealth technology discussing the above problems.     Medication Adjustments/Labs and Tests Ordered: Current medicines are reviewed at length with the patient today.  Concerns regarding medicines are outlined above.   Tests Ordered: None  Medication Changes: None  Follow Up:  In Person in 6 month(s)  Signed, Nelva Bush, MD  06/17/2019 8:24 AM    [facility]  HPI Addended 12/31/19 due to inaccurate PMH dictated into report. Nelva Bush, MD [facility]

## 2019-06-17 NOTE — Telephone Encounter (Signed)
  Patient Consent for Virtual Visit        Nancy Welch has provided verbal consent on 06/17/2019 for a virtual visit (video or telephone).   CONSENT FOR VIRTUAL VISIT FOR:  Nancy Welch  By participating in this virtual visit I agree to the following:  I hereby voluntarily request, consent and authorize [facility] and its employed or contracted physicians, physician assistants, nurse practitioners or other licensed health care professionals (the Practitioner), to provide me with telemedicine health care services (the "Services") as deemed necessary by the treating Practitioner. I acknowledge and consent to receive the Services by the Practitioner via telemedicine. I understand that the telemedicine visit will involve communicating with the Practitioner through live audiovisual communication technology and the disclosure of certain medical information by electronic transmission. I acknowledge that I have been given the opportunity to request an in-person assessment or other available alternative prior to the telemedicine visit and am voluntarily participating in the telemedicine visit.  I understand that I have the right to withhold or withdraw my consent to the use of telemedicine in the course of my care at any time, without affecting my right to future care or treatment, and that the Practitioner or I may terminate the telemedicine visit at any time. I understand that I have the right to inspect all information obtained and/or recorded in the course of the telemedicine visit and may receive copies of available information for a reasonable fee.  I understand that some of the potential risks of receiving the Services via telemedicine include:  Marland Kitchen Delay or interruption in medical evaluation due to technological equipment failure or disruption; . Information transmitted may not be sufficient (e.g. poor resolution of images) to allow for appropriate medical decision making by the Practitioner;  and/or  . In rare instances, security protocols could fail, causing a breach of personal health information.  Furthermore, I acknowledge that it is my responsibility to provide information about my medical history, conditions and care that is complete and accurate to the best of my ability. I acknowledge that Practitioner's advice, recommendations, and/or decision may be based on factors not within their control, such as incomplete or inaccurate data provided by me or distortions of diagnostic images or specimens that may result from electronic transmissions. I understand that the practice of medicine is not an exact science and that Practitioner makes no warranties or guarantees regarding treatment outcomes. I acknowledge that a copy of this consent can be made available to me via my patient portal [facility] MyChart), or I can request a printed copy by calling the office of [facility].    I understand that my insurance will be billed for this visit.   I have read or had this consent read to me. . I understand the contents of this consent, which adequately explains the benefits and risks of the Services being provided via telemedicine.  . I have been provided ample opportunity to ask questions regarding this consent and the Services and have had my questions answered to my satisfaction. . I give my informed consent for the services to be provided through the use of telemedicine in my medical care

## 2019-08-17 ENCOUNTER — Other Ambulatory Visit: Payer: Self-pay | Admitting: Internal Medicine

## 2019-11-21 ENCOUNTER — Other Ambulatory Visit: Payer: Self-pay | Admitting: Internal Medicine

## 2019-11-22 ENCOUNTER — Telehealth: Payer: Self-pay | Admitting: Internal Medicine

## 2019-11-22 NOTE — Telephone Encounter (Signed)
Patient states DR. End would like for her to have a cardiac MRI, please call to discuss.

## 2019-11-23 DIAGNOSIS — I712 Thoracic aortic aneurysm, without rupture, unspecified: Secondary | ICD-10-CM

## 2019-11-23 DIAGNOSIS — Z01810 Encounter for preprocedural cardiovascular examination: Secondary | ICD-10-CM

## 2019-11-23 NOTE — Telephone Encounter (Signed)
Dr End,  I want to make sure does patient only need the MRA of the chest as she had Cardiac MRI at the same time the last time.  THanks!

## 2019-11-23 NOTE — Telephone Encounter (Signed)
MRA of the thoracic aorta only is fine.  Thanks.  Thayer Ohm

## 2019-11-23 NOTE — Telephone Encounter (Signed)
Staff message sent to precert to obtain prior auth. Once obtained, I will call patient to schedule.

## 2019-11-25 NOTE — Telephone Encounter (Signed)
Hepler, Angelina Pih, Sherlynn Stalls, RN Good afternoon,   Nancy Welch is now on file for this patient.   Thanks so much,   [employer]

## 2019-11-29 NOTE — Telephone Encounter (Signed)
Patient scheduled for MRA on 12/16/19 at 10 am. Arrival time of 9:30 am at the [facility].   No answer. Left message to call back.

## 2019-12-01 ENCOUNTER — Other Ambulatory Visit: Payer: Self-pay | Admitting: Internal Medicine

## 2019-12-01 MED ORDER — DIAZEPAM 2 MG PO TABS: 2.0000 mg | ORAL_TABLET | Freq: Once | ORAL | 0 refills | Status: DC

## 2019-12-04 LAB — HM MAMMOGRAPHY

## 2019-12-08 ENCOUNTER — Encounter: Payer: Self-pay | Admitting: *Deleted

## 2019-12-15 ENCOUNTER — Ambulatory Visit: Payer: Managed Care, Other (non HMO) | Admitting: Internal Medicine

## 2019-12-16 ENCOUNTER — Other Ambulatory Visit: Payer: Self-pay

## 2019-12-16 ENCOUNTER — Ambulatory Visit
Admission: RE | Admit: 2019-12-16 | Discharge: 2019-12-16 | Disposition: A | Discharge status: Home / Self Care | Payer: Managed Care, Other (non HMO) | Source: Ambulatory Visit | Attending: Internal Medicine | Admitting: Internal Medicine

## 2019-12-16 DIAGNOSIS — I712 Thoracic aortic aneurysm, without rupture, unspecified: Secondary | ICD-10-CM

## 2019-12-16 MED ORDER — GADOBUTROL 1 MMOL/ML IV SOLN
7.0000 mL | Freq: Once | INTRAVENOUS | Status: AC | PRN
  Administered 2019-12-16: 7 mL via INTRAVENOUS

## 2019-12-31 ENCOUNTER — Ambulatory Visit (INDEPENDENT_AMBULATORY_CARE_PROVIDER_SITE_OTHER): Payer: Managed Care, Other (non HMO) | Admitting: Family Medicine

## 2019-12-31 ENCOUNTER — Ambulatory Visit (INDEPENDENT_AMBULATORY_CARE_PROVIDER_SITE_OTHER): Payer: Managed Care, Other (non HMO) | Admitting: Nurse Practitioner

## 2019-12-31 ENCOUNTER — Encounter: Payer: Self-pay | Admitting: Nurse Practitioner

## 2019-12-31 ENCOUNTER — Other Ambulatory Visit: Payer: Self-pay

## 2019-12-31 ENCOUNTER — Encounter: Payer: Self-pay | Admitting: Family Medicine

## 2019-12-31 VITALS — BP 110/82 | HR 64 | Ht 70.0 in | Wt 165.0 lb

## 2019-12-31 VITALS — BP 128/68 | HR 74 | Temp 97.8°F | Resp 14 | Ht 70.0 in | Wt 164.0 lb

## 2019-12-31 DIAGNOSIS — I493 Ventricular premature depolarization: Secondary | ICD-10-CM

## 2019-12-31 DIAGNOSIS — F419 Anxiety disorder, unspecified: Secondary | ICD-10-CM

## 2019-12-31 DIAGNOSIS — Z0001 Encounter for general adult medical examination with abnormal findings: Secondary | ICD-10-CM

## 2019-12-31 DIAGNOSIS — M542 Cervicalgia: Secondary | ICD-10-CM | POA: Diagnosis not present

## 2019-12-31 DIAGNOSIS — I10 Essential (primary) hypertension: Secondary | ICD-10-CM | POA: Diagnosis not present

## 2019-12-31 DIAGNOSIS — Q231 Congenital insufficiency of aortic valve: Secondary | ICD-10-CM | POA: Diagnosis not present

## 2019-12-31 DIAGNOSIS — I712 Thoracic aortic aneurysm, without rupture, unspecified: Secondary | ICD-10-CM

## 2019-12-31 DIAGNOSIS — G8929 Other chronic pain: Secondary | ICD-10-CM

## 2019-12-31 DIAGNOSIS — Z0000 Encounter for general adult medical examination without abnormal findings: Secondary | ICD-10-CM

## 2019-12-31 MED ORDER — TRAMADOL HCL 50 MG PO TABS: 50.0000 mg | ORAL_TABLET | Freq: Three times a day (TID) | ORAL | 0 refills | Status: DC | PRN

## 2019-12-31 MED ORDER — SERTRALINE HCL 50 MG PO TABS
ORAL_TABLET | ORAL | 2 refills | Status: DC
Start: 2019-12-31 — End: 2020-06-05

## 2019-12-31 NOTE — Progress Notes (Signed)
Subjective:    Patient ID: Nancy Welch, female    DOB: 1975-07-20, 44 y.o.   MRN: [id number]  Patient presents for Annual Exam (is fasting)   Pt here for CPE, meds reviewed    Pt seen by cardiology for angina episodes, had known bicuspic valve , has had intermittant PVC  She is now on Toprol 25mg  XL BID  Losartan 25mg  once a day   At home 130'S  She had CT angio AAA mildy enlarged from previous which concerns her  PVC  Have improved  She has cardiology appt today    Sleeping okay- occ takes benadryl     Mammogram UTD    GYN visit in Sept 2020 - menses has bene irregular , PAP Smear  UTD   Concern she has some thyroid issues, due to the baove with her heart and her menses   She has been strugling to keep weight down   exercising regulray, watching diet    GAD- taking zoloft 50mg  BID,   She is also on alprazolam very sparingly.   Needs Ultram refilled, gets 1 script a year uses this for her chronic neck pain.  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 [place]    UTD Dentist    Review Of Systems:  GEN- denies fatigue, fever, weight loss,weakness, recent illness HEENT- denies eye drainage, change in vision, nasal discharge, CVS- denies chest pain, palpitations RESP- denies SOB, cough, wheeze ABD- denies N/V, change in stools, abd pain GU- denies dysuria, hematuria, dribbling, incontinence MSK- denies joint pain, muscle aches, injury Neuro- denies headache, dizziness, syncope, seizure activity       Objective:    BP 128/68   Pulse 74   Temp 97.8 F (36.6 C) (Temporal)   Resp 14   Ht 5\' 10"  (1.778 m)   Wt 164 lb (74.4 kg)   SpO2 97%   BMI 23.53 kg/m  GEN- NAD, alert and oriented x3 HEENT- PERRL, EOMI, non injected sclera, pink conjunctiva, MMM, oropharynx clear Neck- Supple, no thyromegaly CVS- RRR, no  murmur RESP-CTAB ABD-NABS,soft,NT,ND Psych normal affect and mood  EXT- No edema Pulses- Radial, DP- 2+  FALL/AUDIT C/Depression screen neg       Assessment & Plan:      Problem List Items Addressed This Visit      Unprioritized   Anxiety    Continue zoloft, rare use of xanax She feels better with BID dosing and feels stable on this       Relevant Medications   sertraline (ZOLOFT) 50 MG tablet   Chronic neck pain    Ultram refilled, discussed going back to chiropracter once cleared by cardiology, this has helped Also uses NSAIDS       Relevant Medications   sertraline (ZOLOFT) 50 MG tablet   traMADol (ULTRAM) 50 MG tablet   Essential hypertension    She has had some elevations at home Looks good today Defer to cardiology for changes, has appt today       Relevant Orders   CBC with Differential/Platelet (Completed)   Comprehensive metabolic panel (Completed)   Thoracic aortic aneurysm without rupture (HCC)    BP controlled, check lipids Defer to cardiology wether they want to send to vascular at this point        Other Visit Diagnoses    Routine general medical examination at a health care facility    -  Primary   CPE done, fasting labs, Hep C screen   Relevant Orders   CBC with Differential/Platelet (Completed)   Comprehensive metabolic panel (Completed)   Lipid panel (Completed)   TSH (Completed)   Hepatitis C antibody      Note: This dictation was prepared with Dragon dictation along with smaller phrase technology. Any transcriptional errors that result from this process are unintentional.

## 2019-12-31 NOTE — Patient Instructions (Signed)
Medication Instructions:  Your physician recommends that you continue on your current medications as directed. Please refer to the Current Medication list given to you today.  *If you need a refill on your cardiac medications before your next appointment, please call your pharmacy*   Lab Work: None ordered  If you have labs (blood work) drawn today and your tests are completely normal, you will receive your results only by: Marland Kitchen MyChart Message (if you have MyChart) OR . A paper copy in the mail If you have any lab test that is abnormal or we need to change your treatment, we will call you to review the results.   Testing/Procedures: None ordered   Follow-Up: At [facility], you and your health needs are our priority.  As part of our continuing mission to provide you with exceptional heart care, we have created designated Provider Care Teams.  These Care Teams include your primary Cardiologist (physician) and Advanced Practice Providers (APPs -  Physician Assistants and Nurse Practitioners) who all work together to provide you with the care you need, when you need it.  We recommend signing up for the patient portal called "MyChart".  Sign up information is provided on this After Visit Summary.  MyChart is used to connect with patients for Virtual Visits (Telemedicine).  Patients are able to view lab/test results, encounter notes, upcoming appointments, etc.  Non-urgent messages can be sent to your provider as well.   To learn more about what you can do with MyChart, go to [email or web address].    Your next appointment:   6 month(s)  The format for your next appointment:   Virtual Visit   Provider:   You may see Yvonne Kendall, MD or one of the following Advanced Practice Providers on your designated Care Team:    Nicolasa Ducking, NP

## 2019-12-31 NOTE — Progress Notes (Signed)
Office Visit    Patient Name: Nancy Welch Date of Encounter: 12/31/2019  Primary Care Provider:  Salley Scarlet, MD Primary Cardiologist:  Yvonne Kendall, MD  Chief Complaint    44 y/o ? w/ a h/o depression and anxiety who presents for follow-up of bicuspid aortic valve, thoracic aortic aneurysm, atypical chest pain, and hypertension.  Past Medical History    Past Medical History:  Diagnosis Date  . Anxiety   . Bicuspid aortic valve    a. 2001 Echo ([place]): bicuspid AoV reportedly noted; b. 09/2018 Echo: EF 60-65%, Nl RV fxn, mild TR; c. 01/2018 cMR: EF 61%, RVEF 50%. bicuspid AoV w/o significant stenosis or regurg. Mildly dil asc Ao - 4.2cm.  . Depression   . Dilation of thoracic aorta (HCC)    a.10/2018 cMR: Asc Ao 4.2cm; b. 11/2019 MRA Chest: Aneurysmal dzs of Asc thoracic Ao - max diam 4.3-4.4cm.  Marland Kitchen Heart murmur   . Panic attacks   . PVC's (premature ventricular contractions)   . SBE (subacute bacterial endocarditis) prophylaxis candidate    Past Surgical History:  Procedure Laterality Date  . BREAST CYST ASPIRATION Right 2007  . DILATION AND EVACUATION N/A 12/31/2012   Procedure: DILATATION AND EVACUATION;  Surgeon: Tilda Burrow, MD;  Location: [facility];  Service: Gynecology;  Laterality: N/A;  . ovarian cyst removed     L side     Allergies  Allergies  Allergen Reactions  . Escitalopram Oxalate Nausea Only    Dizziness/ vertigo.   . Pork-Derived Products     Anaphylaxis     History of Present Illness    44 year old female with the above complex past medical history depression, anxiety, bicuspid aortic valve, thoracic aortic aneurysm, atypical chest pain, and hypertension.  She was previously noted to have possible dilation of her ascending aorta on echocardiogram Z[employer] her training as an Secondary school teacher.  Echocardiogram in September 2020 showed normal LV function with mild TR.  The number of leaflets on the aortic valve cannot be easily seen.  This  was followed by cardiac MRI showing an EF of 60% with normal RV function.  Bicuspid aortic valve without significant stenosis or regurgitation was noted.  Mildly dilated ascending aorta of 4.2 cm was also noted.  She was last seen for cardiology telemedicine visit in May of this year, at which time she was doing well.  Blood pressure was well controlled on metoprolol and losartan therapy.  She notes that since then, she has been exercising 3 times a week, participating in aerobic activity.  Based on our recommendations, she is not doing any weight training.  She occasionally notes a sharp and fleeting chest pain but overall feels that these occur less frequently than prior to starting losartan therapy.  She does note some mild exercise intolerance in the setting of beta-blocker therapy but is not experiencing angina or dyspnea out of proportion to activity.  She does check her blood pressure on a regular basis and typically trends in the 120s.  She had a follow-up MRA of the aorta in November which showed a slight increase in maximum diameter of the ascending aorta at 4.3 to 4.4 cm.  Overall, this was felt to be stable with recommendation for follow-up MRA in 1 year.  She denies palpitations, PND, orthopnea, dizziness, syncope, edema, or early satiety.  Home Medications    Prior to Admission medications   Medication Sig Start Date End Date Taking? Authorizing Provider  losartan (COZAAR) 25 MG tablet  TAKE 1 TABLET BY MOUTH EVERY DAY 11/22/19   End, Cristal Deer, MD  metoprolol succinate (TOPROL XL) 25 MG 24 hr tablet Take 1 tablet (25 mg total) by mouth 2 (two) times daily. 03/25/19   End, Cristal Deer, MD  Multiple Vitamins-Minerals (MULTIVITAMIN WITH MINERALS) tablet Take 1 tablet by mouth daily. 04/21/17   Salley Scarlet, MD  sertraline (ZOLOFT) 50 MG tablet Take 2 tablets at bedtime 12/31/19   Fonda, Velna Hatchet, MD  traMADol (ULTRAM) 50 MG tablet Take 1 tablet (50 mg total) by mouth every 8 (eight)  hours as needed. 12/31/19   Salley Scarlet, MD    Review of Systems    Doing well since last visit in May.  Still notes occasional sharp and fleeting chest pain though these have decreased in frequency over time.  Generally tolerates exercise well though beta-blocker does cause some fatigue.  She denies angina, dyspnea, palpitations, PND, orthopnea, dizziness, syncope, edema, or early satiety.  All other systems reviewed and are otherwise negative except as noted above.  Physical Exam    VS:  BP 110/82 (BP Location: Left Arm, Patient Position: Sitting, Cuff Size: Normal)   Pulse 64   Ht 5\' 10"  (1.778 m)   Wt 165 lb (74.8 kg)   SpO2 98%   BMI 23.68 kg/m  , BMI Body mass index is 23.68 kg/m. GEN: Well nourished, well developed, in no acute distress. HEENT: normal. Neck: Supple, no JVD, carotid bruits, or masses. Cardiac: RRR, no murmurs, rubs, or gallops. No clubbing, cyanosis, edema.  PT 2+ and equal bilaterally.  Respiratory:  Respirations regular and unlabored, clear to auscultation bilaterally. GI: Soft, nontender, nondistended, BS + x 4. MS: no deformity or atrophy. Skin: warm and dry, no rash. Neuro:  Strength and sensation are intact. Psych: Normal affect.  Accessory Clinical Findings    ECG personally reviewed by me today -regular sinus rhythm, 64 - no acute changes.  Lab Results  Component Value Date   WBC 6.1 11/17/2018   HGB 13.6 11/17/2018   HCT 41.1 11/17/2018   MCV 90.1 11/17/2018   PLT 239 11/17/2018   Lab Results  Component Value Date   CREATININE 0.67 04/13/2019   BUN 13 04/13/2019   NA 138 04/13/2019   K 4.0 04/13/2019   CL 103 04/13/2019   CO2 28 04/13/2019   Lab Results  Component Value Date   ALT 11 11/17/2018   AST 18 11/17/2018   ALKPHOS 63 09/01/2015   BILITOT 1.5 (H) 11/17/2018   Lab Results  Component Value Date   CHOL 223 (H) 11/17/2018   HDL 78 11/17/2018   LDLCALC 129 (H) 11/17/2018   TRIG 66 11/17/2018   CHOLHDL 2.9  11/17/2018     Assessment & Plan    1.  Thoracic aortic aneurysm and bicuspid aortic valve: Patient has been stable since last visit.  Blood pressure much better controlled and 110/82 today.  Recent follow-up MRA showed slight increase in size of ascending thoracic aorta with a maximum diameter of 4.3-4.4, up from 4.2.  Discussed results today.  Plan for follow-up MRA in a year.  2.  Essential hypertension: Stable on beta-blocker and ARB therapy.  She does check her blood pressure regularly at home and typically trends in the 120s.  We discussed that if she began to note trends in the 130s that she should notify 11/19/2018 at which point, we would consider titration of losartan.  3.  Atypical chest pain: She has a history of  sharp and fleeting chest pain and has noted that with better blood pressure control following the addition of losartan, the symptoms are less frequent.  She exercises regularly without any significant limitations.  4.  Palpitations/PVCs: Quiescent on beta-blocker therapy.  5.  Disposition: Video visit in 6 months for blood pressure follow-up.  Otherwise follow-up MRA in 1 year.   Nicolasa Ducking, NP 12/31/2019, 4:38 PM

## 2019-12-31 NOTE — Patient Instructions (Addendum)
F/U 1 year We will send results via mychart

## 2020-01-01 ENCOUNTER — Encounter: Payer: Self-pay | Admitting: Family Medicine

## 2020-01-02 ENCOUNTER — Encounter: Payer: Self-pay | Admitting: Family Medicine

## 2020-01-02 NOTE — Assessment & Plan Note (Signed)
Continue zoloft, rare use of xanax She feels better with BID dosing and feels stable on this

## 2020-01-02 NOTE — Assessment & Plan Note (Signed)
Ultram refilled, discussed going back to chiropracter once cleared by cardiology, this has helped Also uses NSAIDS

## 2020-01-02 NOTE — Assessment & Plan Note (Signed)
She has had some elevations at home Looks good today Defer to cardiology for changes, has appt today

## 2020-01-02 NOTE — Assessment & Plan Note (Signed)
BP controlled, check lipids Defer to cardiology wether they want to send to vascular at this point

## 2020-01-03 LAB — LIPID PANEL
Cholesterol: 250 mg/dL — ABNORMAL HIGH (ref <200)
HDL: 74 mg/dL (ref >=50)
LDL Cholesterol (Calc): 160 mg/dL (calc) — ABNORMAL HIGH
Non-HDL Cholesterol (Calc): 176 mg/dL (calc) — ABNORMAL HIGH (ref <130)
Total CHOL/HDL Ratio: 3.4 (calc) (ref <5.0)
Triglycerides: 68 mg/dL (ref <150)

## 2020-01-03 LAB — CBC WITH DIFFERENTIAL/PLATELET
Absolute Monocytes: 499 cells/uL (ref 200–950)
Basophils Absolute: 38 cells/uL (ref 0–200)
Basophils Relative: 0.8 %
Eosinophils Absolute: 312 cells/uL (ref 15–500)
Eosinophils Relative: 6.5 %
HCT: 41.1 % (ref 35.0–45.0)
Hemoglobin: 13.3 g/dL (ref 11.7–15.5)
Lymphs Abs: 2146 cells/uL (ref 850–3900)
MCH: 29.3 pg (ref 27.0–33.0)
MCHC: 32.4 g/dL (ref 32.0–36.0)
MCV: 90.5 fL (ref 80.0–100.0)
MPV: 10.4 fL (ref 7.5–12.5)
Monocytes Relative: 10.4 %
Neutro Abs: 1805 cells/uL (ref 1500–7800)
Neutrophils Relative %: 37.6 %
Platelets: 251 10*3/uL (ref 140–400)
RBC: 4.54 10*6/uL (ref 3.80–5.10)
RDW: 11.8 % (ref 11.0–15.0)
Total Lymphocyte: 44.7 %
WBC: 4.8 10*3/uL (ref 3.8–10.8)

## 2020-01-03 LAB — COMPREHENSIVE METABOLIC PANEL
AG Ratio: 1.6 (calc) (ref 1.0–2.5)
ALT: 13 U/L (ref 6–29)
AST: 18 U/L (ref 10–30)
Albumin: 4.2 g/dL (ref 3.6–5.1)
Alkaline phosphatase (APISO): 51 U/L (ref 31–125)
BUN: 9 mg/dL (ref 7–25)
CO2: 23 mmol/L (ref 20–32)
Calcium: 9.5 mg/dL (ref 8.6–10.2)
Chloride: 103 mmol/L (ref 98–110)
Creat: 0.65 mg/dL (ref 0.50–1.10)
Globulin: 2.7 g/dL (calc) (ref 1.9–3.7)
Glucose, Bld: 86 mg/dL (ref 65–99)
Potassium: 4.3 mmol/L (ref 3.5–5.3)
Sodium: 138 mmol/L (ref 135–146)
Total Bilirubin: 1.1 mg/dL (ref 0.2–1.2)
Total Protein: 6.9 g/dL (ref 6.1–8.1)

## 2020-01-03 LAB — HEPATITIS C ANTIBODY
Hepatitis C Ab: NONREACTIVE
SIGNAL TO CUT-OFF: 0.01 (ref <1.00)

## 2020-01-03 LAB — TSH: TSH: 1.18 mIU/L

## 2020-01-05 ENCOUNTER — Telehealth: Payer: Self-pay | Admitting: *Deleted

## 2020-01-05 NOTE — Telephone Encounter (Signed)
Received request from pharmacy for PA on   PA submitted.   Dx: M54.2-  Cervicalgia  Received immediate determination.   PA approved 01/05/2020- 07/03/2020.  Determination faxed to pharmacy.

## 2020-01-10 ENCOUNTER — Other Ambulatory Visit: Payer: Self-pay

## 2020-01-10 ENCOUNTER — Encounter (HOSPITAL_COMMUNITY): Payer: Self-pay | Admitting: *Deleted

## 2020-01-10 ENCOUNTER — Telehealth: Payer: Self-pay | Admitting: Family Medicine

## 2020-01-10 ENCOUNTER — Ambulatory Visit (HOSPITAL_COMMUNITY)
Admission: EM | Admit: 2020-01-10 | Discharge: 2020-01-10 | Disposition: A | Discharge status: Home / Self Care | Payer: Managed Care, Other (non HMO) | Attending: Internal Medicine | Admitting: Internal Medicine

## 2020-01-10 DIAGNOSIS — T7840XA Allergy, unspecified, initial encounter: Secondary | ICD-10-CM

## 2020-01-10 DIAGNOSIS — L500 Allergic urticaria: Secondary | ICD-10-CM | POA: Diagnosis not present

## 2020-01-10 MED ORDER — EPINEPHRINE 0.3 MG/0.3ML IJ SOAJ: 0.3000 mg | INTRAMUSCULAR | 0 refills | Status: AC | PRN

## 2020-01-10 MED ORDER — CYPROHEPTADINE HCL 4 MG PO TABS
ORAL_TABLET | ORAL | 0 refills | Status: DC
Start: 2020-01-10 — End: 2020-09-15

## 2020-01-10 NOTE — Telephone Encounter (Signed)
Call placed to patient to inquire.   Reports that she is having reaction to new allergen and is having hives. States that she is concerned she may be developing a reaction to pork. Requested full panel to determine allergies.   Advised that allergy testing is not usually done here in office. Advised we can refer to allergy specialist. Agreeable to plan.   Ok to place referral?

## 2020-01-10 NOTE — Telephone Encounter (Signed)
Referral orders placed

## 2020-01-10 NOTE — ED Provider Notes (Signed)
[facility]    CSN: [id number] Arrival date & time: 01/10/20  5465      History   Chief Complaint Chief Complaint  Patient presents with  . Allergic Reaction    HPI Nancy Welch is a 44 y.o. female presentd sue to developing hives last Wed. And been taking Benadryl, prednisone, Tagamet, Claritin. , but they are not resolving. Her co-worker NP saw her rash and placed her on the meds mentioned. 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 She wonders if she has developed a new allergy and would like an allergy serum panel done here. Has not seen her PCP for this.  She ate [place] which was close to a ham sandwich and wonders if there was some contamination, other than that, there has not been any new meds or foods. Today she denies SOB or trouble swallowing.     Past Medical History:  Diagnosis Date  . Anxiety   . Bicuspid aortic valve    a. 2001 Echo ([place]): bicuspid AoV reportedly noted; b. 09/2018 Echo: EF 60-65%, Nl RV fxn, mild TR; c. 01/2018 cMR: EF 61%, RVEF 50%. bicuspid AoV w/o significant stenosis or regurg. Mildly dil asc Ao - 4.2cm.  . Depression   . Dilation of thoracic aorta (HCC)    a.10/2018 cMR: Asc Ao 4.2cm; b. 11/2019 MRA Chest: Aneurysmal dzs of Asc thoracic Ao - max diam 4.3-4.4cm.  Marland Kitchen Heart murmur   . Panic attacks   . PVC's (premature ventricular contractions)   . SBE (subacute bacterial endocarditis) prophylaxis candidate     Patient Active Problem List   Diagnosis Date Noted  . Thoracic aortic aneurysm without rupture (HCC) 10/27/2018  . Bicuspid aortic valve 09/25/2018  . Essential hypertension 09/25/2018  . Dyspareunia due to medical condition in female 08/22/2017  . History of cervical dysplasia 07/30/2016  . Chronic neck pain 09/01/2015  . Atypical chest pain 06/28/2013  . Incomplete spontaneous abortion without  mention of complication 12/31/2012  . PVC's (premature ventricular contractions)   . Anxiety   . SBE (subacute bacterial endocarditis) prophylaxis candidate   . PREMATURE VENTRICULAR CONTRACTIONS 02/07/2010  . BICUSPID AORTIC VALVE 02/07/2010    Past Surgical History:  Procedure Laterality Date  . BREAST CYST ASPIRATION Right 2007  . DILATION AND EVACUATION N/A 12/31/2012   Procedure: DILATATION AND EVACUATION;  Surgeon: Tilda Burrow, MD;  Location: [facility];  Service: Gynecology;  Laterality: N/A;  . ovarian cyst removed     L side     OB History    Gravida  5   Para  2   Term  2   Preterm      AB  3   Living  2     SAB  3   IAB      Ectopic      Multiple  0   Live Births  2            Home Medications    Prior to Admission medications   Medication Sig Start Date End Date Taking? Authorizing Provider  losartan (COZAAR) 25 MG tablet TAKE 1 TABLET BY MOUTH EVERY DAY 11/22/19  Yes End, Cristal Deer, MD  metoprolol succinate (TOPROL XL) 25 MG 24 hr tablet Take 1 tablet (25 mg total) by mouth 2 (two) times daily. 03/25/19  Yes End, Cristal Deer, MD  Multiple Vitamins-Minerals (  MULTIVITAMIN WITH MINERALS) tablet Take 1 tablet by mouth daily. 04/21/17  Yes Maury, Velna Hatchet, MD  sertraline (ZOLOFT) 50 MG tablet Take 2 tablets at bedtime 12/31/19  Yes Walcott, Velna Hatchet, MD  traMADol (ULTRAM) 50 MG tablet Take 1 tablet (50 mg total) by mouth every 8 (eight) hours as needed. 12/31/19  Yes [place], Velna Hatchet, MD    Family History Family History  Problem Relation Age of Onset  . Hypertension Mother   . Uterine cancer Mother   . Squamous cell carcinoma Mother   . Breast cancer Paternal Grandmother        30's  . Arrhythmia Sister   . Lupus Sister   . Rheum arthritis Sister   . Heart attack Paternal Grandfather     Social History Social History   Tobacco Use  . Smoking status: Former Games developer  . Smokeless tobacco: Never Used  . Tobacco comment: smoked 1/2 ppd  for 5 years; quit in 2005   Vaping Use  . Vaping Use: Never used  Substance Use Topics  . Alcohol use: Yes    Comment: rare  . Drug use: Never     Allergies   Escitalopram oxalate and Pork-derived products   Review of Systems Review of Systems  HENT: Negative for congestion.   Respiratory: Negative for chest tightness, shortness of breath and wheezing.   Cardiovascular: Negative for chest pain.  Gastrointestinal: Negative for abdominal pain, diarrhea, nausea and vomiting.  Musculoskeletal: Negative for arthralgias, gait problem and joint swelling.  Skin: Negative for rash.  Allergic/Immunologic: Positive for food allergies.  Neurological: Negative for dizziness, weakness and headaches.  Hematological: Negative for adenopathy.     Physical Exam Triage Vital Signs ED Triage Vitals  Enc Vitals Group     BP 01/10/20 0915 (!) 131/99     Pulse Rate 01/10/20 0915 (!) 55     Resp 01/10/20 0915 18     Temp 01/10/20 0915 97.6 F (36.4 C)     Temp Source 01/10/20 0915 Oral     SpO2 01/10/20 0915 100 %     Weight 01/10/20 0918 165 lb (74.8 kg)     Height 01/10/20 0918 5\' 10"  (1.778 m)     Head Circumference --      Peak Flow --      Pain Score 01/10/20 0917 1     Pain Loc --      Pain Edu? --      Excl. in GC? --    No data found.  Updated Vital Signs BP (!) 131/99 (BP Location: Right Arm)   Pulse (!) 55   Temp 97.6 F (36.4 C) (Oral)   Resp 18   Ht 5\' 10"  (1.778 m)   Wt 165 lb (74.8 kg)   LMP 12/23/2019   SpO2 100%   BMI 23.68 kg/m   Visual Acuity Right Eye Distance:   Left Eye Distance:   Bilateral Distance:    Right Eye Near:   Left Eye Near:    Bilateral Near:     Physical Exam Vitals and nursing note reviewed.  Constitutional:      Appearance: She is normal weight.     Comments: Who was itching her arm the entire visit  HENT:     Right Ear: External ear normal.     Left Ear: External ear normal.  Eyes:     General: No scleral  icterus. Cardiovascular:     Rate and Rhythm: Normal rate and regular rhythm.  Heart sounds: No murmur heard.   Pulmonary:     Effort: Pulmonary effort is normal.     Breath sounds: Normal breath sounds.  Musculoskeletal:        General: Normal range of motion.     Cervical back: Neck supple.  Lymphadenopathy:     Cervical: No cervical adenopathy.  Skin:    General: Skin is warm and dry.     Findings: Rash present.     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   Neurological:     Mental Status: She is alert and oriented to person, place, and time.     Gait: Gait normal.  Psychiatric:        Mood and Affect: Mood normal.        Behavior: Behavior normal.        Thought Content: Thought content normal.        Judgment: Judgment normal.     UC Treatments / Results  Labs (all labs ordered are listed, but only abnormal results are displayed) Labs Reviewed - No data to display  EKG   Radiology No results found.  Procedures Procedures (including critical care time)  Medications Ordered in UC Medications - No data to display  Initial Impression / Assessment and Plan / UC Course  I have reviewed the triage vital signs and the nursing notes. I explained to pt that we dont order Allergy panels here and has to be done with her PCP which I do strongly recommend to do. I placed her on Periactin and is to d/c the Benadryl while on this, but may continue the Loratadine and Tagamet.  Final Clinical Impressions(s) / UC Diagnoses   Final diagnoses:  None   Discharge Instructions   None    ED Prescriptions    None     PDMP not reviewed this encounter.   Garey Ham, [place] 01/10/20 [phone]

## 2020-01-10 NOTE — Telephone Encounter (Signed)
Okay to placer referral

## 2020-01-10 NOTE — Telephone Encounter (Signed)
Patient stopped by requesting lab work to see what she is allergic to. She went to UC this morning and they told her she was having an allergic reaction and they can't order the blood work.  CB# [phone]

## 2020-01-10 NOTE — ED Triage Notes (Signed)
PT reports hives and itching started last WED. Pt is taking benadryl  Prednisone ,Loratadine, Tagamet.. Pt still has visible hives on back ,forehead .

## 2020-01-20 ENCOUNTER — Other Ambulatory Visit: Payer: Self-pay

## 2020-01-20 ENCOUNTER — Encounter: Payer: Self-pay | Admitting: Allergy

## 2020-01-20 ENCOUNTER — Ambulatory Visit (INDEPENDENT_AMBULATORY_CARE_PROVIDER_SITE_OTHER): Payer: Managed Care, Other (non HMO) | Admitting: Allergy

## 2020-01-20 VITALS — BP 144/80 | HR 80 | Temp 98.7°F | Resp 18 | Ht 70.0 in | Wt 164.6 lb

## 2020-01-20 DIAGNOSIS — T783XXD Angioneurotic edema, subsequent encounter: Secondary | ICD-10-CM

## 2020-01-20 DIAGNOSIS — T7800XD Anaphylactic reaction due to unspecified food, subsequent encounter: Secondary | ICD-10-CM | POA: Diagnosis not present

## 2020-01-20 DIAGNOSIS — L509 Urticaria, unspecified: Secondary | ICD-10-CM | POA: Diagnosis not present

## 2020-01-20 NOTE — Patient Instructions (Addendum)
Hives  - at this time etiology of hives and swelling is unknown.  Hives can be caused by a variety of different triggers including illness/infection, foods, medications, stings, exercise, pressure, vibrations, extremes of temperature to name a few however majority of the time there is no identifiable trigger.    - will obtain the following labs: tryptase, hive panel with thyroid antibodies, environmental panel, alpha-gal panel, shellfish panel, nut panel  - should significant symptoms recur or new symptoms occur, a journal is to be kept recording any foods eaten, beverages consumed, medications taken, activities performed, and environmental conditions within a 6 hour time period prior to the onset of symptoms. For any symptoms concerning for anaphylaxis, epinephrine is to be administered and 911 is to be called immediately.   - continue to avoid red meats, shellfish and nuts in the diet until labs return  - follow emergency action plan in case of allergic reaction  - if hives return take the following antihistamine regimen: claritin 1 tab twice a day with tagamet 1 tab twice a day.    If needed can add cyproheptadine or benadryl  Food allergy   - continue avoidance of pork in the diet  - alpha gal panel above will show your pork IgE and can compare to your 2014 test  - have access to self-injectable epinephrine (Epipen or AuviQ) 0.3mg  at all times    Follow-up in 3-4 months or sooner if needed

## 2020-01-20 NOTE — Progress Notes (Signed)
New Patient Note  RE: Nancy Welch MRN: [id number] DOB: 02/10/1975 Date of Office Visit: 01/20/2020  Referring provider: Salley Scarlet, MD Primary care provider: Salley Scarlet, MD  Chief Complaint: hives  History of present illness: Nancy Welch is a 44 y.o. female presenting today for consultation for hives.     She states she had a bad breakout of hives several weeks ago.  She went to UC on 01/10/20 for this breakout.   She states the hives started a week before this.  She did get a course of prednisone taper.  The hives did start getting better but then seems to worsen while on the taper.  Hives resolved about a week ago.  Hives were itchy.  She reports having swelling around the eyes and of her mouth.  No fevers. No preceding illness.  No new medications.  No new foods. No stings. No change in soaps/lotions/detergents. No joints aches/pains.  She states there is a bruise at one of the hives on her legs but states she was rubbing it a lot (trying not to scratch it and break the skin).  Areas where she was scratching she did break skin and has some scabbing. She is concerned the hives are a new allergy to something and is concerned about nuts.  She states she normally eats peanut/nuts very often and she did cut nuts out and states the hives did improve.  She also cut out dairy, soy, fish, shellfish, tomato product, chocolate, beef.  She has since reintroduced tomato sauce, chocolate, milk, fish, soy, beef and had no return of hives.  She has not reintroduced nuts or shellfish.   She states she develops hives with pork ingestion and does have an epipen.  She states she also has felt her mouth and throat were swelling when she had initial reaction.  She states she first had issues with pork in 2014.    No history of asthma or eczema.  She does report having some nasal congestion and sneezing but nothing that warrants medication management.    Pork IgE from 2014 in ku/L is 0.48.     Review of systems: Review of Systems  Constitutional: Negative.   HENT: Negative.   Eyes: Negative.   Respiratory: Negative.   Cardiovascular: Negative.   Gastrointestinal: Negative.   Musculoskeletal: Negative.   Skin: Positive for itching and rash.  Neurological: Negative.     All other systems negative unless noted above in HPI  Past medical history: Past Medical History:  Diagnosis Date   Anxiety    Bicuspid aortic valve    a. 2001 Echo ([place]): bicuspid AoV reportedly noted; b. 09/2018 Echo: EF 60-65%, Nl RV fxn, mild TR; c. 01/2018 cMR: EF 61%, RVEF 50%. bicuspid AoV w/o significant stenosis or regurg. Mildly dil asc Ao - 4.2cm.   Depression    Dilation of thoracic aorta (HCC)    a.10/2018 cMR: Asc Ao 4.2cm; b. 11/2019 MRA Chest: Aneurysmal dzs of Asc thoracic Ao - max diam 4.3-4.4cm.   Heart murmur    Panic attacks    PVC's (premature ventricular contractions)    SBE (subacute bacterial endocarditis) prophylaxis candidate     Past surgical history: Past Surgical History:  Procedure Laterality Date   BREAST CYST ASPIRATION Right 2007   DILATION AND EVACUATION N/A 12/31/2012   Procedure: DILATATION AND EVACUATION;  Surgeon: Tilda Burrow, MD;  Location: [facility];  Service: Gynecology;  Laterality: N/A;   ovarian cyst removed  L side     Family history:  Family History  Problem Relation Age of Onset   Hypertension Mother    Uterine cancer Mother    Squamous cell carcinoma Mother    Breast cancer Paternal Grandmother        62's   Arrhythmia Sister    Lupus Sister    Rheum arthritis Sister    Heart attack Paternal Grandfather     Social history: Lives in a home with carpeting in the bedroom with electric heating and central cooling.  1 lizard in the home.  No concern for water damage, mildew or roaches in the home. She is a cardiac sonographer.  Denies smoking history.   Medication List: Current Outpatient Medications   Medication Sig Dispense Refill   cyproheptadine (PERIACTIN) 4 MG tablet One q 6h prn itching, only bid if taking Loratadine 30 tablet 0   EPINEPHrine 0.3 mg/0.3 mL IJ SOAJ injection Inject 0.3 mg into the muscle as needed for anaphylaxis. 1 each 0   losartan (COZAAR) 25 MG tablet TAKE 1 TABLET BY MOUTH EVERY DAY 90 tablet 0   metoprolol succinate (TOPROL XL) 25 MG 24 hr tablet Take 1 tablet (25 mg total) by mouth 2 (two) times daily. 180 tablet 3   Multiple Vitamins-Minerals (MULTIVITAMIN WITH MINERALS) tablet Take 1 tablet by mouth daily. 30 tablet 0   sertraline (ZOLOFT) 50 MG tablet Take 2 tablets at bedtime 180 tablet 2   traMADol (ULTRAM) 50 MG tablet Take 1 tablet (50 mg total) by mouth every 8 (eight) hours as needed. 45 tablet 0   No current facility-administered medications for this visit.    Known medication allergies: Allergies  Allergen Reactions   Escitalopram Oxalate Nausea Only    Dizziness/ vertigo.    Pork-Derived Products     Anaphylaxis      Physical examination: Blood pressure (!) 144/80, pulse 80, temperature 98.7 F (37.1 C), temperature source Temporal, resp. rate 18, height 5\' 10"  (1.778 m), weight 164 lb 9.6 oz (74.7 kg), last menstrual period 12/23/2019, SpO2 95 %, currently breastfeeding.  General: Alert, interactive, in no acute distress. HEENT: PERRLA, TMs pearly gray, turbinates non-edematous without discharge, post-pharynx non erythematous. Neck: Supple without lymphadenopathy. Lungs: Clear to auscultation without wheezing, rhonchi or rales. {no increased work of breathing. CV: Normal S1, S2 without murmurs. Abdomen: Nondistended, nontender. Skin: Warm and dry, without lesions or rashes. Extremities:  No clubbing, cyanosis or edema. Neuro:   Grossly intact.  Diagnositics/Labs: Labs:  Component     Latest Ref Rng & Units 12/31/2019  WBC     3.8 - 10.8 Thousand/uL 4.8  RBC     3.80 - 5.10 Million/uL 4.54  Hemoglobin     11.7 - 15.5  g/dL 14/03/2019  HCT     22.9 - 79.8 % 41.1  MCV     80.0 - 100.0 fL 90.5  MCH     27.0 - 33.0 pg 29.3  MCHC     32.0 - 36.0 g/dL 92.1  RDW     19.4 - 17.4 % 11.8  Platelets     140 - 400 Thousand/uL 251  MPV     7.5 - 12.5 fL 10.4  NEUT#     1,500 - 7,800 cells/uL 1,805  Lymphocyte #     850 - 3,900 cells/uL 2,146  Absolute Monocytes     200 - 950 cells/uL 499  Eosinophils Absolute     15 - 500 cells/uL 312  Basophils  Absolute     0 - 200 cells/uL 38  Neutrophils     % 37.6  Total Lymphocyte     % 44.7  Monocytes Relative     % 10.4  Eosinophil     % 6.5  Basophil     % 0.8  Glucose     65 - 99 mg/dL 86  BUN     7 - 25 mg/dL 9  Creatinine     8.650.50 - 1.10 mg/dL 7.840.65  BUN/Creatinine Ratio     6 - 22 (calc) NOT APPLICABLE  Sodium     135 - 146 mmol/L 138  Potassium     3.5 - 5.3 mmol/L 4.3  Chloride     98 - 110 mmol/L 103  CO2     20 - 32 mmol/L 23  Calcium     8.6 - 10.2 mg/dL 9.5  Total Protein     6.1 - 8.1 g/dL 6.9  Albumin MSPROF     3.6 - 5.1 g/dL 4.2  Globulin     1.9 - 3.7 g/dL (calc) 2.7  AG Ratio     1.0 - 2.5 (calc) 1.6  Total Bilirubin     0.2 - 1.2 mg/dL 1.1  Alkaline phosphatase (APISO)     31 - 125 U/L 51  AST     10 - 30 U/L 18  ALT     6 - 29 U/L 13  Cholesterol     <200 mg/dL 696250 (H)  HDL Cholesterol     > OR = 50 mg/dL 74  Triglycerides     <295<150 mg/dL 68  LDL Cholesterol (Calc)     mg/dL (calc) 284160 (H)  Total CHOL/HDL Ratio     <5.0 (calc) 3.4  Non-HDL Cholesterol (Calc)     <130 mg/dL (calc) 132176 (H)  Hepatitis C Ab     NON-REACTI NON-REACTIVE  SIGNAL TO CUT-OFF     <1.00 0.01  TSH     mIU/L 1.18    Assessment and plan: Urticaria with angioedema  - at this time etiology of hives and swelling is unknown.  Hives can be caused by a variety of different triggers including illness/infection, foods, medications, stings, exercise, pressure, vibrations, extremes of temperature to name a few however majority of the time  there is no identifiable trigger.    - will obtain the following labs: tryptase, hive panel with thyroid antibodies, environmental panel, alpha-gal panel, shellfish panel, nut panel  - should significant symptoms recur or new symptoms occur, a journal is to be kept recording any foods eaten, beverages consumed, medications taken, activities performed, and environmental conditions within a 6 hour time period prior to the onset of symptoms. For any symptoms concerning for anaphylaxis, epinephrine is to be administered and 911 is to be called immediately.   - continue to avoid red meats, shellfish and nuts in the diet until labs return  - follow emergency action plan in case of allergic reaction  - if hives return take the following antihistamine regimen: claritin 1 tab twice a day with tagamet 1 tab twice a day.    If needed can add cyproheptadine or benadryl  Anaphylaxis due to food   - continue avoidance of pork in the diet  - alpha gal panel above will show your pork IgE and can compare to your 2014 test  - have access to self-injectable epinephrine (Epipen or AuviQ) 0.3mg  at all times    Follow-up in 3-4 months or  sooner if needed  I appreciate the opportunity to take part in Grier's care. Please do not hesitate to contact me with questions.  Sincerely,   Margo Aye, MD Allergy/Immunology [facility]

## 2020-02-02 LAB — THYROID ANTIBODIES
Thyroglobulin Antibody: 28.3 IU/mL — ABNORMAL HIGH (ref 0.0–0.9)
Thyroperoxidase Ab SerPl-aCnc: 14 IU/mL (ref 0–34)

## 2020-02-02 LAB — ALLERGENS(7)
Brazil Nut IgE: <0.1 kU/L
F020-IgE Almond: <0.1 kU/L
F202-IgE Cashew Nut: <0.1 kU/L
Hazelnut (Filbert) IgE: <0.1 kU/L
Peanut IgE: <0.1 kU/L
Pecan Nut IgE: <0.1 kU/L
Walnut IgE: <0.1 kU/L

## 2020-02-02 LAB — ALLERGENS W/TOTAL IGE AREA 2

## 2020-02-02 LAB — ALLERGEN PROFILE, SHELLFISH
Clam IgE: <0.1 kU/L
F023-IgE Crab: <0.1 kU/L
F080-IgE Lobster: <0.1 kU/L
F290-IgE Oyster: <0.1 kU/L
Scallop IgE: <0.1 kU/L
Shrimp IgE: <0.1 kU/L

## 2020-02-02 LAB — TRYPTASE: Tryptase: 4.9 ug/L (ref 2.2–13.2)

## 2020-02-02 LAB — ALPHA-GAL PANEL
Allergen Lamb IgE: <0.1 kU/L
Beef IgE: <0.1 kU/L
IgE (Immunoglobulin E), Serum: 56 IU/mL (ref 6–495)
O215-IgE Alpha-Gal: <0.1 kU/L
Pork IgE: 0.26 kU/L — AB

## 2020-02-02 LAB — CHRONIC URTICARIA: cu index: 3.3 (ref <10)

## 2020-02-22 ENCOUNTER — Other Ambulatory Visit: Payer: Self-pay | Admitting: Internal Medicine

## 2020-02-25 NOTE — Telephone Encounter (Signed)
consent

## 2020-04-06 ENCOUNTER — Other Ambulatory Visit: Payer: Self-pay | Admitting: Internal Medicine

## 2020-04-19 ENCOUNTER — Ambulatory Visit: Payer: Managed Care, Other (non HMO) | Admitting: Allergy

## 2020-04-19 ENCOUNTER — Telehealth: Payer: Self-pay | Admitting: Allergy

## 2020-04-19 NOTE — Telephone Encounter (Signed)
This would have been a typical 59-month follow-up for hive issues.  This visit is to see if she has had any further hives and if she potentially would need to have additional treatment done like starting Xolair for example for better hive management.  We typically discuss this at this visit if hives have continued to be an issue.  If she is avoiding pork and has not had any further hives then she can reschedule for another 1 to 2 months out (making it a 4 to 53-month follow-up from her initial visit.)

## 2020-04-19 NOTE — Telephone Encounter (Signed)
Spoke to patient and she stated she has not had any hives and she has not eaten any pork. She stated a high A.I.G may be creating hives. I did schedule her an appointment for the end of May.

## 2020-04-19 NOTE — Telephone Encounter (Signed)
Please advise 

## 2020-04-19 NOTE — Telephone Encounter (Signed)
What's AIG?  Im not sure what that is.

## 2020-04-19 NOTE — Telephone Encounter (Signed)
Patient had to cancel her appointment for today due to being sick and car trouble. She was unsure if she should reschedule. She said she is allergic to pork and she knows that. She wanted to know if it was necessary to come in to be seen or if she was just going to be referred out somewhere. If so, she would rather do that over the phone.

## 2020-06-05 ENCOUNTER — Other Ambulatory Visit: Payer: Self-pay | Admitting: Family Medicine

## 2020-06-22 ENCOUNTER — Ambulatory Visit: Payer: Self-pay | Admitting: Allergy

## 2020-07-05 ENCOUNTER — Telehealth (INDEPENDENT_AMBULATORY_CARE_PROVIDER_SITE_OTHER): Payer: Managed Care, Other (non HMO) | Admitting: Internal Medicine

## 2020-07-05 ENCOUNTER — Other Ambulatory Visit: Payer: Self-pay

## 2020-07-05 DIAGNOSIS — Q231 Congenital insufficiency of aortic valve: Secondary | ICD-10-CM

## 2020-07-05 NOTE — Progress Notes (Signed)
Erroneous encounter no show  

## 2020-09-15 ENCOUNTER — Telehealth (INDEPENDENT_AMBULATORY_CARE_PROVIDER_SITE_OTHER): Payer: Managed Care, Other (non HMO) | Admitting: Internal Medicine

## 2020-09-15 ENCOUNTER — Encounter: Payer: Self-pay | Admitting: Internal Medicine

## 2020-09-15 ENCOUNTER — Other Ambulatory Visit: Payer: Self-pay

## 2020-09-15 VITALS — BP 125/83 | HR 71 | Ht 70.0 in | Wt 156.0 lb

## 2020-09-15 DIAGNOSIS — I712 Thoracic aortic aneurysm, without rupture, unspecified: Secondary | ICD-10-CM

## 2020-09-15 DIAGNOSIS — Q231 Congenital insufficiency of aortic valve: Secondary | ICD-10-CM | POA: Diagnosis not present

## 2020-09-15 DIAGNOSIS — I10 Essential (primary) hypertension: Secondary | ICD-10-CM | POA: Diagnosis not present

## 2020-09-15 NOTE — Patient Instructions (Addendum)
Medication Instructions:   Your physician recommends that you continue on your current medications as directed. Please refer to the Current Medication list given to you today.  *If you need a refill on your cardiac medications before your next appointment, please call your pharmacy*   Lab Work:  Please have labs completed the same day that you have your Echocardiogram (11/16/20) at [facility] Address: [address], [place], [place] [address] (Across the street from [facility])    - You do not need to make an appointment   - You may arrive anytime between 7:30 AM - 4:00 PM - CLOSED FOR LUNCH 12:00 - 1:00 pm   - Lipid panel, CMET   - Please make sure you are FASTING for these labs   Testing/Procedures:  1) Your physician has requested that you have an echocardiogram (in approximately 2 months). Echocardiography is a painless test that uses sound waves to create images of your heart. It provides your doctor with information about the size and shape of your heart and how well your heart's chambers and valves are working. This procedure takes approximately one hour. There are no restrictions for this procedure.  Your echocardiogram is scheduled at [facility] [place] 11/16/20 at 11:00 AM. Please arrive 15 min prior to your appointment.  The address is: [address] [place], [place] [address]   - If you need to reschedule this appointment you may call [phone] and ask for Vickie.  2) Your physician has recommended that you have an MRA of the chest in November 2022 to re-evaluate your Thoracic aortic aneurysm. This may be completed at [facility].   - Please call [phone] to schedule this for November.   Follow-Up: At [facility], you and your health needs are our priority.  As part of our continuing mission to provide you with exceptional heart care, we have created designated Provider Care Teams.  These Care Teams include your primary  Cardiologist (physician) and Advanced Practice Providers (APPs -  Physician Assistants and Nurse Practitioners) who all work together to provide you with the care you need, when you need it.  We recommend signing up for the patient portal called "MyChart".  Sign up information is provided on this After Visit Summary.  MyChart is used to connect with patients for Virtual Visits (Telemedicine).  Patients are able to view lab/test results, encounter notes, upcoming appointments, etc.  Non-urgent messages can be sent to your provider as well.   To learn more about what you can do with MyChart, go to [email or web address].    Your next appointment:   6 month(s)  The format for your next appointment:   In Person  Provider:   You may see Yvonne Kendall, MD or one of the following Advanced Practice Providers on your designated Care Team:   Nicolasa Ducking, NP Eula Listen, PA-C Marisue Ivan, PA-C Cadence Banks [place], [place]

## 2020-09-15 NOTE — Progress Notes (Signed)
Virtual Visit via Telephone Note   This visit type was conducted due to national recommendations for restrictions regarding the COVID-19 Pandemic (e.g. social distancing) in an effort to limit this patient's exposure and mitigate transmission in our community.  Due to her co-morbid illnesses, this patient is at least at moderate risk for complications without adequate follow up.  This format is felt to be most appropriate for this patient at this time.  The patient did not have access to video technology/had technical difficulties with video requiring transitioning to audio format only (telephone).  All issues noted in this document were discussed and addressed.  No physical exam could be performed with this format.  Please refer to the patient's chart for her  consent to telehealth for [facility].    Date:  09/16/2020   ID:  Nancy Welch, DOB 23-Apr-1975, MRN [id number] The patient was identified using 2 identifiers.  Patient Location: Other:    Provider Location: Office/Clinic   PCP:  Alycia Rossetti, MD   [facility] Providers Cardiologist:  Nelva Bush, MD     Evaluation Performed:  Follow-Up Visit  Chief Complaint: Follow-up bicuspid aortic valve and thoracic aortic aneurysm  History of Present Illness:    Nancy Welch is a 45 y.o. female with history of bicuspid aortic valve, thoracic aortic aneurysm, hypertension, depression, and anxiety.  We are speaking today for follow-up of her aortic/aortic valve disease.  She was last seen in our office in 12/2019 by Ignacia Bayley, NP.  Preceding MRA of the chest showed relatively stable mild enlargement of the ascending aorta around 4.4 cm.  No medication changes were made.  Plans for follow-up MRA in 1 year were discussed.  Today, Ms. Breit reports that she has been feeling well.  She increased sertraline on her own from 50 mg daily to 50 mg twice daily and feels like this has helped her chest pain.  She has not had any chest  pain recently.  She also denies shortness of breath and lightheadedness.  She notes only rare sporadic palpitations.  She is exercising regularly.  She feels like metoprolol slows her down a little bit, though this is not lifestyle limiting.  Home blood pressure has been well controlled, similar to today's reading.  Past Medical History:  Diagnosis Date   Anxiety    Bicuspid aortic valve    a. 2001 Echo ([place]): bicuspid AoV reportedly noted; b. 09/2018 Echo: EF 60-65%, Nl RV fxn, mild TR; c. 01/2018 cMR: EF 61%, RVEF 50%. bicuspid AoV w/o significant stenosis or regurg. Mildly dil asc Ao - 4.2cm.   Depression    Dilation of thoracic aorta ([place])    a.10/2018 cMR: Asc Ao 4.2cm; b. 11/2019 MRA Chest: Aneurysmal dzs of Asc thoracic Ao - max diam 4.3-4.4cm.   Heart murmur    Panic attacks    PVC's (premature ventricular contractions)    SBE (subacute bacterial endocarditis) prophylaxis candidate    Past Surgical History:  Procedure Laterality Date   BREAST CYST ASPIRATION Right 2007   DILATION AND EVACUATION N/A 12/31/2012   Procedure: DILATATION AND EVACUATION;  Surgeon: Jonnie Kind, MD;  Location: [facility];  Service: Gynecology;  Laterality: N/A;   ovarian cyst removed     L side      Current Meds  Medication Sig   diphenhydrAMINE (BENADRYL) 25 mg capsule Take 25 mg by mouth every 6 (six) hours as needed.   EPINEPHrine 0.3 mg/0.3 mL IJ SOAJ injection Inject 0.3 mg  into the muscle as needed for anaphylaxis.   losartan (COZAAR) 25 MG tablet TAKE 1 TABLET BY MOUTH EVERY DAY   metoprolol succinate (TOPROL-XL) 25 MG 24 hr tablet TAKE 1 TABLET BY MOUTH TWICE A DAY   Multiple Vitamins-Minerals (MULTIVITAMIN WITH MINERALS) tablet Take 1 tablet by mouth daily.   sertraline (ZOLOFT) 50 MG tablet Take 50 mg by mouth 2 (two) times daily.   traMADol (ULTRAM) 50 MG tablet Take 1 tablet (50 mg total) by mouth every 8 (eight) hours as needed.   [DISCONTINUED] sertraline (ZOLOFT) 50 MG tablet TAKE 1  TABLET BY MOUTH EVERYDAY AT BEDTIME     Allergies:   Escitalopram oxalate and Pork-derived products   Social History   Tobacco Use   Smoking status: Former   Smokeless tobacco: Never   Tobacco comments:    smoked 1/2 ppd for 5 years; quit in 2005   Vaping Use   Vaping Use: Never used  Substance Use Topics   Alcohol use: Not Currently    Comment: rare   Drug use: Never     Family Hx: The patient's family history includes Arrhythmia in her sister; Breast cancer in her paternal grandmother; Heart attack in her paternal grandfather; Hypertension in her mother; Lupus in her sister; Rheum arthritis in her sister; Squamous cell carcinoma in her mother; Uterine cancer in her mother.  ROS:   Please see the history of present illness.    All other systems reviewed and are negative.  Labs/Other Tests and Data Reviewed:    EKG:  No ECG reviewed.  Recent Labs: 12/31/2019: ALT 13; BUN 9; Creat 0.65; Hemoglobin 13.3; Platelets 251; Potassium 4.3; Sodium 138; TSH 1.18   Recent Lipid Panel Lab Results  Component Value Date/Time   CHOL 250 (H) 12/31/2019 11:12 AM   TRIG 68 12/31/2019 11:12 AM   HDL 74 12/31/2019 11:12 AM   CHOLHDL 3.4 12/31/2019 11:12 AM   LDLCALC 160 (H) 12/31/2019 11:12 AM    Wt Readings from Last 3 Encounters:  09/15/20 156 lb (70.8 kg)  01/20/20 164 lb 9.6 oz (74.7 kg)  01/10/20 165 lb (74.8 kg)     Objective:    Vital Signs:  BP 125/83 Comment: taken on 09/12/2020  Pulse 71   Ht '5\' 10"'  (1.778 m)   Wt 156 lb (70.8 kg)   BMI 22.38 kg/m    VITAL SIGNS:  reviewed  ASSESSMENT & PLAN:    Bicuspid aortic valve: No symptoms of heart failure reported.  We will plan to repeat echocardiogram at the patient's convenience to assess for progression of borderline aortic stenosis noted on initial echocardiogram almost 2 years ago.  She wishes to have this done in our [place] office.  Thoracic aortic aneurysm: MRA in 11/2019 showed stable mild enlargement of the  thoracic aorta.  Plan for annual follow-up.  Continue blood pressure control.  Hypertension: Blood pressure well controlled.  Continue current doses of metoprolol and losartan.  Hyperlipidemia: In the setting of thoracic aortic aneurysm, aggressive lipid control is indicated.  Ms. Aughenbaugh has been working on lifestyle modifications, with her most recent lipid panel in 12/2019 demonstrating significantly elevated LDL of 160.  We will plan to repeat a lipid panel and CMP at her convenience and consider adding statin therapy if LDL remains elevated.   Time:   Today, I have spent 9 minutes with the patient with telehealth technology discussing the above problems.     Medication Adjustments/Labs and Tests Ordered: Current medicines are reviewed  at length with the patient today.  Concerns regarding medicines are outlined above.   Tests Ordered: Orders Placed This Encounter  Procedures   MR ANGIO CHEST W WO CONTRAST   Comp Met (CMET)   Lipid Profile   ECHOCARDIOGRAM COMPLETE     Medication Changes: None   Follow Up:  In Person in 6 month(s)  Signed, Nelva Bush, MD  09/16/2020 2:41 PM    [place]

## 2020-09-16 ENCOUNTER — Encounter: Payer: Self-pay | Admitting: Internal Medicine

## 2020-09-19 ENCOUNTER — Telehealth: Payer: Self-pay | Admitting: *Deleted

## 2020-09-19 DIAGNOSIS — I10 Essential (primary) hypertension: Secondary | ICD-10-CM

## 2020-09-19 DIAGNOSIS — Z79899 Other long term (current) drug therapy: Secondary | ICD-10-CM

## 2020-09-19 DIAGNOSIS — I712 Thoracic aortic aneurysm, without rupture, unspecified: Secondary | ICD-10-CM

## 2020-09-19 NOTE — Telephone Encounter (Signed)
See MyChart message

## 2020-09-20 NOTE — Addendum Note (Signed)
Addended by: Lanny Hurst on: 09/20/2020 09:24 AM   Modules accepted: Orders

## 2020-11-16 ENCOUNTER — Other Ambulatory Visit: Payer: Managed Care, Other (non HMO)

## 2020-11-16 ENCOUNTER — Other Ambulatory Visit: Payer: Self-pay

## 2020-11-16 ENCOUNTER — Other Ambulatory Visit
Admission: RE | Admit: 2020-11-16 | Discharge: 2020-11-16 | Disposition: A | Discharge status: Home / Self Care | Payer: Managed Care, Other (non HMO) | Attending: Internal Medicine | Admitting: Internal Medicine

## 2020-11-16 ENCOUNTER — Ambulatory Visit (INDEPENDENT_AMBULATORY_CARE_PROVIDER_SITE_OTHER): Payer: Managed Care, Other (non HMO)

## 2020-11-16 DIAGNOSIS — Q231 Congenital insufficiency of aortic valve: Secondary | ICD-10-CM

## 2020-11-16 DIAGNOSIS — I10 Essential (primary) hypertension: Secondary | ICD-10-CM | POA: Diagnosis present

## 2020-11-16 DIAGNOSIS — I712 Thoracic aortic aneurysm, without rupture, unspecified: Secondary | ICD-10-CM | POA: Insufficient documentation

## 2020-11-16 DIAGNOSIS — Z79899 Other long term (current) drug therapy: Secondary | ICD-10-CM | POA: Insufficient documentation

## 2020-11-16 LAB — ECHOCARDIOGRAM COMPLETE
AR max vel: 2.67 cm2
AV Area VTI: 2.59 cm2
AV Area mean vel: 2.5 cm2
AV Mean grad: 7 mmHg
AV Peak grad: 12.5 mmHg
Ao pk vel: 1.77 m/s
Area-P 1/2: 3.03 cm2
Calc EF: 49.5 %
S' Lateral: 4 cm
Single Plane A2C EF: 48.6 %
Single Plane A4C EF: 48.8 %

## 2020-11-16 LAB — COMPREHENSIVE METABOLIC PANEL
ALT: 14 U/L (ref 0–44)
AST: 19 U/L (ref 15–41)
Albumin: 4.3 g/dL (ref 3.5–5.0)
Alkaline Phosphatase: 46 U/L (ref 38–126)
Anion gap: 6 (ref 5–15)
BUN: 13 mg/dL (ref 6–20)
CO2: 26 mmol/L (ref 22–32)
Calcium: 9.1 mg/dL (ref 8.9–10.3)
Chloride: 101 mmol/L (ref 98–111)
Creatinine, Ser: 0.68 mg/dL (ref 0.44–1.00)
GFR, Estimated: >60 mL/min (ref >60)
Glucose, Bld: 95 mg/dL (ref 70–99)
Potassium: 3.8 mmol/L (ref 3.5–5.1)
Sodium: 133 mmol/L — ABNORMAL LOW (ref 135–145)
Total Bilirubin: 2 mg/dL — ABNORMAL HIGH (ref 0.3–1.2)
Total Protein: 7.4 g/dL (ref 6.5–8.1)

## 2020-11-16 LAB — LIPID PANEL
Cholesterol: 228 mg/dL — ABNORMAL HIGH (ref 0–200)
HDL: 83 mg/dL (ref >40)
LDL Cholesterol: 135 mg/dL — ABNORMAL HIGH (ref 0–99)
Total CHOL/HDL Ratio: 2.7 RATIO
Triglycerides: 48 mg/dL (ref <150)
VLDL: 10 mg/dL (ref 0–40)

## 2020-11-17 ENCOUNTER — Telehealth: Payer: Self-pay

## 2020-11-17 MED ORDER — ROSUVASTATIN CALCIUM 5 MG PO TABS: 5.0000 mg | ORAL_TABLET | Freq: Every day | ORAL | 3 refills | Status: DC

## 2020-11-17 NOTE — Telephone Encounter (Signed)
Able to reach pt regarding her recent ECHO & Labs, Dr. Okey Dupre had a chance to review her results and advised   "Please let Ms. Khawaja know that her labs show persistent elevated cholesterol with an LDL of 138.  While this has improved since last December, it is still well above our goal.  If she is agreeable, I suggest adding a statin, such as rosuvastatin 5 mg daily, with repeat lipid panel and hepatic panel in ~3 months.  Echocardiogram shows that her heart is contracting normally, with mild leakage of the mitral valve and enlargement of the aorta that is similar to prior MRA in November.  We should arrange for previously ordered MRA of the chest to assess ascending aortic aneurysm to be done next month. "  Mrs. Horace agrees to start Crestor 5 mg daily, request for medication to be sent to [facility].  Mrs. Dettmer in agreeable to proceed with MRA in Nov, but would like for it to be done in [place] as that is were she is currently working. Advised will have Dr. Serita Kyle nurse try to arrange for this. Mrs. Siess prefers for a hospital/imagine center that takes her insurance. She was able to provide two facilities that may be able to accommodate order for MRA. [facility]  Mrs. Farruggia verbalized understanding, is okay if not able to arrange in [place], then can try at [facility] for MRA, but would perfer if Dr. Serita Kyle nurse would try as it would be convenient for her. Otherwise will start Crestor once it arrives in the mail, questions were address and no additional concerns at this time. Agreeable to plan, will call back for anything further.

## 2020-11-22 NOTE — Telephone Encounter (Signed)
Called and spoke with [facility] with [facility], they confirmed that they do not perform MRA procedures at their facility.  Called to notify pt and to remind her of phone number to call to schedule MRA of chest at [facility].  No answer. Left detailed message on vm (DPR approved) notifying pt of the above and to call [phone] to schedule for November.  MyChart message also sent to pt.  Asked pt to please call back with any further questions.

## 2020-11-28 MED ORDER — DIAZEPAM 2 MG PO TABS: 2.0000 mg | ORAL_TABLET | Freq: Once | ORAL | 0 refills | Status: AC

## 2020-12-01 ENCOUNTER — Ambulatory Visit (HOSPITAL_COMMUNITY)
Admission: RE | Admit: 2020-12-01 | Discharge: 2020-12-01 | Disposition: A | Discharge status: Home / Self Care | Payer: Managed Care, Other (non HMO) | Source: Ambulatory Visit | Attending: Internal Medicine | Admitting: Internal Medicine

## 2020-12-01 ENCOUNTER — Other Ambulatory Visit: Payer: Self-pay

## 2020-12-01 ENCOUNTER — Other Ambulatory Visit: Payer: Self-pay | Admitting: Internal Medicine

## 2020-12-01 DIAGNOSIS — I712 Thoracic aortic aneurysm, without rupture, unspecified: Secondary | ICD-10-CM | POA: Diagnosis present

## 2020-12-01 MED ORDER — GADOBUTROL 1 MMOL/ML IV SOLN
7.0000 mL | Freq: Once | INTRAVENOUS | Status: AC | PRN
  Administered 2020-12-01: 7 mL via INTRAVENOUS

## 2020-12-19 ENCOUNTER — Telehealth: Payer: Self-pay

## 2020-12-19 ENCOUNTER — Other Ambulatory Visit: Payer: Self-pay

## 2020-12-19 DIAGNOSIS — R928 Other abnormal and inconclusive findings on diagnostic imaging of breast: Secondary | ICD-10-CM

## 2020-12-19 NOTE — Telephone Encounter (Signed)
Additional orders for diagnostic right and right [place] faxed over. They are refaxing the mammogram results to my attn

## 2020-12-19 NOTE — Telephone Encounter (Signed)
Nancy Welch called from [facility] in [place], [phone] ext. [phone],   Need order for yearly mammo gram must come back to have additional views for right breast for compression and right breast ultrasound. Please fax the order to [phone].  Been trying since November 15th needs patient back in ASAP.

## 2021-01-03 ENCOUNTER — Ambulatory Visit: Payer: Managed Care, Other (non HMO) | Admitting: Nurse Practitioner

## 2021-01-15 ENCOUNTER — Encounter: Payer: Self-pay | Admitting: Nurse Practitioner

## 2021-01-15 ENCOUNTER — Other Ambulatory Visit: Payer: Self-pay

## 2021-01-15 ENCOUNTER — Ambulatory Visit (INDEPENDENT_AMBULATORY_CARE_PROVIDER_SITE_OTHER): Payer: Managed Care, Other (non HMO) | Admitting: Nurse Practitioner

## 2021-01-15 VITALS — BP 117/84 | HR 71 | Temp 99.9°F | Ht 68.9 in | Wt 158.6 lb

## 2021-01-15 DIAGNOSIS — G8929 Other chronic pain: Secondary | ICD-10-CM

## 2021-01-15 DIAGNOSIS — F419 Anxiety disorder, unspecified: Secondary | ICD-10-CM

## 2021-01-15 DIAGNOSIS — I10 Essential (primary) hypertension: Secondary | ICD-10-CM

## 2021-01-15 DIAGNOSIS — E7800 Pure hypercholesterolemia, unspecified: Secondary | ICD-10-CM

## 2021-01-15 DIAGNOSIS — I7121 Aneurysm of the ascending aorta, without rupture: Secondary | ICD-10-CM | POA: Insufficient documentation

## 2021-01-15 DIAGNOSIS — M542 Cervicalgia: Secondary | ICD-10-CM

## 2021-01-15 MED ORDER — TRAMADOL HCL 50 MG PO TABS: 50.0000 mg | ORAL_TABLET | Freq: Three times a day (TID) | ORAL | 0 refills | Status: DC | PRN
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
## 2021-01-16 LAB — LIPID PANEL WITH LDL/HDL RATIO
Cholesterol, Total: 180 mg/dL (ref 100–199)
HDL: 89 mg/dL (ref >39)
LDL Chol Calc (NIH): 77 mg/dL (ref 0–99)
LDL/HDL Ratio: 0.9 ratio (ref 0.0–3.2)
Triglycerides: 75 mg/dL (ref 0–149)
VLDL Cholesterol Cal: 14 mg/dL (ref 5–40)

## 2021-01-16 LAB — MICROALBUMIN / CREATININE URINE RATIO
Creatinine, Urine: 26.7 mg/dL
Microalb/Creat Ratio: <11 mg/g creat (ref 0–29)
Microalbumin, Urine: <3 ug/mL

## 2021-01-16 LAB — HEMOGLOBIN A1C
Est. average glucose Bld gHb Est-mCnc: 114 mg/dL
Hgb A1c MFr Bld: 5.6 % (ref 4.8–5.6)

## 2021-01-17 NOTE — Progress Notes (Signed)
Hello Nancy Welch,   All your labs came back as normal. Keep doing what you are doing. Merry Christmas and Happy New Year. We will see you in about a year for your next wellness visit.

## 2021-03-03 ENCOUNTER — Other Ambulatory Visit: Payer: Self-pay | Admitting: Internal Medicine

## 2021-03-21 ENCOUNTER — Encounter: Payer: Self-pay | Admitting: Internal Medicine

## 2021-03-21 ENCOUNTER — Other Ambulatory Visit: Payer: Self-pay

## 2021-03-21 ENCOUNTER — Ambulatory Visit (INDEPENDENT_AMBULATORY_CARE_PROVIDER_SITE_OTHER): Payer: Managed Care, Other (non HMO) | Admitting: Internal Medicine

## 2021-03-21 VITALS — BP 120/80 | HR 64 | Ht 70.0 in | Wt 160.0 lb

## 2021-03-21 DIAGNOSIS — I7121 Aneurysm of the ascending aorta, without rupture: Secondary | ICD-10-CM

## 2021-03-21 DIAGNOSIS — I10 Essential (primary) hypertension: Secondary | ICD-10-CM

## 2021-03-21 DIAGNOSIS — R0789 Other chest pain: Secondary | ICD-10-CM

## 2021-03-21 DIAGNOSIS — Q231 Congenital insufficiency of aortic valve: Secondary | ICD-10-CM

## 2021-03-21 NOTE — Patient Instructions (Signed)
Medication Instructions:   Your physician recommends that you continue on your current medications as directed. Please refer to the Current Medication list given to you today.  *If you need a refill on your cardiac medications before your next appointment, please call your pharmacy*   Lab Work:  None ordered  Testing/Procedures:  Your physician has recommended that you have an MRA of the chest in November 2023 to re-evaluate your Thoracic aortic aneurysm.   You may call [phone] to schedule.    Follow-Up: At [facility], you and your health needs are our priority.  As part of our continuing mission to provide you with exceptional heart care, we have created designated Provider Care Teams.  These Care Teams include your primary Cardiologist (physician) and Advanced Practice Providers (APPs -  Physician Assistants and Nurse Practitioners) who all work together to provide you with the care you need, when you need it.  We recommend signing up for the patient portal called "MyChart".  Sign up information is provided on this After Visit Summary.  MyChart is used to connect with patients for Virtual Visits (Telemedicine).  Patients are able to view lab/test results, encounter notes, upcoming appointments, etc.  Non-urgent messages can be sent to your provider as well.   To learn more about what you can do with MyChart, go to [email or web address].    Your next appointment:   1 year(s)  The format for your next appointment:   In Person  Provider:   You may see Yvonne Kendall, MD or one of the following Advanced Practice Providers on your designated Care Team:   Nicolasa Ducking, NP Eula Listen, PA-C Cadence Fransico Michael, [place]

## 2021-03-21 NOTE — Progress Notes (Signed)
Follow-up Outpatient Visit Date: 03/21/2021  Primary Care Provider: Deneise Lever, NP [address] [place] [address]  Chief Complaint: Follow-up bicuspid aortic valve and thoracic aortic aneurysm  HPI:  [name] is a 46 y.o. female with history of bicuspid aortic valve, thoracic aortic aneurysm, hypertension, depression, and anxiety, who presents for follow-up of bicuspid aortic valve and thoracic aortic aneurysm.  We last spoke via virtual visit in 08/2018, at which time [name] was feeling well.  Follow-up echo in 10/2020 showed relatively stable moderate dilation of the ascending aorta measuring up to 4.4 cm.  Aortic valve was not well visualized but did not have any evidence of stenosis or regurgitation by Doppler parameters.  Subsequent MRA of the chest showed relatively stable enlargement of the ascending aorta, measuring up to 4.3 cm.  Today, [name] reports that she has been feeling fairly well.  She still gets random chest pains that happen a few times a week.  They are less frequent than in the past.  She describes the discomfort as a substernal pressure that lasts 10 to 30 seconds.  It is not related to any particular activity.  She still exercises regularly without any symptoms.  She denies shortness of breath, palpitations, lightheadedness, and edema.  --------------------------------------------------------------------------------------------------  Past Medical History:  Diagnosis Date   Anxiety    Bicuspid aortic valve    a. 2001 Echo ([place]): bicuspid AoV reportedly noted; b. 09/2018 Echo: EF 60-65%, Nl RV fxn, mild TR; c. 01/2018 cMR: EF 61%, RVEF 50%. bicuspid AoV w/o significant stenosis or regurg. Mildly dil asc Ao - 4.2cm.   Depression    Dilation of thoracic aorta ([facility])    a.10/2018 cMR: Asc Ao 4.2cm; b. 11/2019 MRA Chest: Aneurysmal dzs of Asc thoracic Ao - max diam 4.3-4.4cm.   Heart murmur    Panic attacks    PVC's (premature ventricular  contractions)    SBE (subacute bacterial endocarditis) prophylaxis candidate    Past Surgical History:  Procedure Laterality Date   BREAST CYST ASPIRATION Right 2007   DILATION AND EVACUATION N/A 12/31/2012   Procedure: DILATATION AND EVACUATION;  Surgeon: Tilda Burrow, MD;  Location: [facility];  Service: Gynecology;  Laterality: N/A;   ovarian cyst removed     L side     Current Meds  Medication Sig   diphenhydrAMINE (BENADRYL) 25 mg capsule Take 25 mg by mouth every 6 (six) hours as needed.   EPINEPHrine 0.3 mg/0.3 mL IJ SOAJ injection Inject 0.3 mg into the muscle as needed for anaphylaxis.   losartan (COZAAR) 25 MG tablet TAKE 1 TABLET BY MOUTH EVERY DAY   metoprolol succinate (TOPROL-XL) 25 MG 24 hr tablet TAKE 1 TABLET BY MOUTH TWICE A DAY   Multiple Vitamins-Minerals (MULTIVITAMIN WITH MINERALS) tablet Take 1 tablet by mouth daily.   rosuvastatin (CRESTOR) 5 MG tablet Take 1 tablet (5 mg total) by mouth daily.   sertraline (ZOLOFT) 50 MG tablet Take 50 mg by mouth 2 (two) times daily.   traMADol (ULTRAM) 50 MG tablet Take 1 tablet (50 mg total) by mouth every 8 (eight) hours as needed.    Allergies: Escitalopram oxalate and Pork-derived products  Social History   Tobacco Use   Smoking status: Former   Smokeless tobacco: Never   Tobacco comments:    smoked 1/2 ppd for 5 years; quit in 2005   Vaping Use   Vaping Use: Never used  Substance Use Topics   Alcohol use:  Yes    Comment: 1-2 glasses of red wine per week   Drug use: Never    Family History  Problem Relation Age of Onset   Hypertension Mother    Uterine cancer Mother    Squamous cell carcinoma Mother    Breast cancer Paternal Grandmother        34's   Arrhythmia Sister    Lupus Sister    Rheum arthritis Sister    Heart attack Paternal Grandfather     Review of Systems: A 12-system review of systems was performed and was negative except as noted in the  HPI.  --------------------------------------------------------------------------------------------------  Physical Exam: BP 120/80 (BP Location: Left Arm, Patient Position: Sitting, Cuff Size: Normal)    Pulse 64    Ht 5\' 10"  (1.778 m)    Wt 160 lb (72.6 kg)    SpO2 98%    BMI 22.96 kg/m   General:  NAD. Neck: No JVD or HJR. Lungs: Clear to auscultation bilaterally without wheezes or crackles. Heart: Regular rate and rhythm with 1/6 systolic murmur and isolated extrasystole.  No rubs or gallops. Abdomen: Soft, nontender, nondistended. Extremities: No lower extremity edema.  EKG: Normal sinus rhythm without abnormality.  No change from prior tracing on 12/31/2019.  Lab Results  Component Value Date   WBC 4.8 12/31/2019   HGB 13.3 12/31/2019   HCT 41.1 12/31/2019   MCV 90.5 12/31/2019   PLT 251 12/31/2019    Lab Results  Component Value Date   NA 133 (L) 11/16/2020   K 3.8 11/16/2020   CL 101 11/16/2020   CO2 26 11/16/2020   BUN 13 11/16/2020   CREATININE 0.68 11/16/2020   GLUCOSE 95 11/16/2020   ALT 14 11/16/2020    Lab Results  Component Value Date   CHOL 180 01/15/2021   HDL 89 01/15/2021   LDLCALC 77 01/15/2021   TRIG 75 01/15/2021   CHOLHDL 2.7 11/16/2020    --------------------------------------------------------------------------------------------------  ASSESSMENT AND PLAN: Bicuspid aortic valve and thoracic aortic aneurysm: Echo and MRA last fall were stable.  Transient atypical chest pain not consistent with acute aortic syndrome.  We discussed warning signs that would warrant immediate medical attention.  Continue lipid and blood pressure control with current regimen of rosuvastatin, losartan, and metoprolol.  I advised [name] to avoid fluoroquinolones were she to need antibiotics and the future.  We will plan to repeat an MRI of the chest in 11/2021 to ensure stability of her thoracic aortic aneurysm.  Atypical chest pain: Transient chest pressure is  less frequent than at prior visits and is not exertional.  No further work-up recommended at this time.  Hypertension: Blood pressure well controlled today.  Continue current regimen of losartan and metoprolol.  Follow-up: Return to clinic in 1 year.  12/2021, MD 03/21/2021 4:22 PM

## 2021-04-04 ENCOUNTER — Other Ambulatory Visit: Payer: Self-pay | Admitting: Internal Medicine

## 2021-06-04 ENCOUNTER — Other Ambulatory Visit: Payer: Self-pay | Admitting: Internal Medicine

## 2021-09-03 ENCOUNTER — Other Ambulatory Visit: Payer: Self-pay | Admitting: Internal Medicine

## 2021-09-24 ENCOUNTER — Other Ambulatory Visit: Payer: Self-pay | Admitting: Internal Medicine

## 2021-09-25 MED ORDER — LOSARTAN POTASSIUM 25 MG PO TABS: 25.0000 mg | ORAL_TABLET | Freq: Every day | ORAL | 2 refills | Status: DC

## 2021-10-04 ENCOUNTER — Telehealth: Payer: Self-pay | Admitting: *Deleted

## 2021-10-04 ENCOUNTER — Other Ambulatory Visit: Payer: Self-pay | Admitting: Nurse Practitioner

## 2021-10-04 NOTE — Telephone Encounter (Signed)
Attempted to call pt to remind due for MRA chest in November.  No answer. Left detailed message (ok per DPR) to call central scheduling number @ [phone] to schedule.  Advised pt on vm to call our office with any questions.

## 2021-10-04 NOTE — Telephone Encounter (Signed)
-----   Message from Annia Belt, RN sent at 03/27/2021 11:01 AM EST ----- Regarding: schedule MRA Pt due for MRA aorta November 2023. [facility]21[facility].

## 2021-10-08 ENCOUNTER — Other Ambulatory Visit (HOSPITAL_COMMUNITY): Payer: Self-pay

## 2021-10-08 MED ORDER — SERTRALINE HCL 50 MG PO TABS
50.0000 mg | ORAL_TABLET | Freq: Two times a day (BID) | ORAL | 0 refills | Status: DC
Start: 2021-10-08 — End: 2021-11-08
  Filled 2021-10-08: qty 60, 30d supply, fill #0

## 2021-10-09 ENCOUNTER — Other Ambulatory Visit (HOSPITAL_COMMUNITY): Payer: Self-pay

## 2021-10-16 ENCOUNTER — Encounter: Payer: Self-pay | Admitting: Internal Medicine

## 2021-10-16 ENCOUNTER — Other Ambulatory Visit (HOSPITAL_COMMUNITY): Payer: Self-pay

## 2021-10-16 MED ORDER — DIAZEPAM 2 MG PO TABS: ORAL_TABLET | ORAL | 0 refills | Status: DC

## 2021-10-25 ENCOUNTER — Ambulatory Visit: Admission: RE | Admit: 2021-10-25 | Payer: Managed Care, Other (non HMO) | Source: Ambulatory Visit

## 2021-10-25 ENCOUNTER — Encounter (HOSPITAL_COMMUNITY): Payer: Self-pay

## 2021-10-25 ENCOUNTER — Ambulatory Visit (HOSPITAL_COMMUNITY): Payer: Managed Care, Other (non HMO)

## 2021-10-26 MED ORDER — DIAZEPAM 2 MG PO TABS: ORAL_TABLET | ORAL | 0 refills | Status: DC

## 2021-10-26 NOTE — Addendum Note (Signed)
Addended by: Aradhya Shellenbarger A on: 10/26/2021 02:41 PM   Modules accepted: Orders

## 2021-10-28 ENCOUNTER — Ambulatory Visit
Admission: RE | Admit: 2021-10-28 | Discharge: 2021-10-28 | Disposition: A | Discharge status: Home / Self Care | Payer: Managed Care, Other (non HMO) | Source: Ambulatory Visit | Attending: Internal Medicine | Admitting: Internal Medicine

## 2021-10-28 DIAGNOSIS — I7121 Aneurysm of the ascending aorta, without rupture: Secondary | ICD-10-CM | POA: Insufficient documentation

## 2021-10-28 MED ORDER — GADOPICLENOL 0.5 MMOL/ML IV SOLN
7.0000 mL | Freq: Once | INTRAVENOUS | Status: AC | PRN
  Administered 2021-10-28: 7 mL via INTRAVENOUS

## 2021-11-08 ENCOUNTER — Other Ambulatory Visit: Payer: Self-pay | Admitting: Nurse Practitioner

## 2021-11-09 ENCOUNTER — Other Ambulatory Visit (HOSPITAL_COMMUNITY): Payer: Self-pay

## 2021-11-09 MED ORDER — SERTRALINE HCL 50 MG PO TABS
50.0000 mg | ORAL_TABLET | Freq: Two times a day (BID) | ORAL | 0 refills | Status: DC
  Filled 2021-11-09: qty 60, 30d supply, fill #0

## 2021-11-10 ENCOUNTER — Other Ambulatory Visit (HOSPITAL_COMMUNITY): Payer: Self-pay

## 2021-11-19 ENCOUNTER — Encounter: Payer: Self-pay | Admitting: Nurse Practitioner

## 2021-11-22 ENCOUNTER — Encounter: Payer: Self-pay | Admitting: Internal Medicine

## 2021-11-22 ENCOUNTER — Other Ambulatory Visit: Payer: Self-pay | Admitting: Internal Medicine

## 2021-11-22 NOTE — Telephone Encounter (Signed)
[employer], Trenton Gammon, NP     Can you find a Varicella titer for her? I did not see one. If you do not find one, please call patient and let her know that she probably needs a repeat test. It can be done here or with the health department.   Barbee Shropshire

## 2021-11-22 NOTE — Telephone Encounter (Signed)
PCP does not get copies of vaccinations or lab work done via [facility] at work- she would need to contact [place] at work to see if they have results

## 2021-12-22 ENCOUNTER — Other Ambulatory Visit: Payer: Self-pay | Admitting: Internal Medicine

## 2022-01-02 ENCOUNTER — Ambulatory Visit (INDEPENDENT_AMBULATORY_CARE_PROVIDER_SITE_OTHER): Payer: Managed Care, Other (non HMO) | Admitting: Family Medicine

## 2022-01-02 DIAGNOSIS — I10 Essential (primary) hypertension: Secondary | ICD-10-CM

## 2022-01-02 DIAGNOSIS — F419 Anxiety disorder, unspecified: Secondary | ICD-10-CM | POA: Diagnosis not present

## 2022-01-02 DIAGNOSIS — Z0000 Encounter for general adult medical examination without abnormal findings: Secondary | ICD-10-CM

## 2022-01-02 MED ORDER — TRAMADOL HCL 50 MG PO TABS: 50.0000 mg | ORAL_TABLET | Freq: Three times a day (TID) | ORAL | 0 refills | Status: DC | PRN

## 2022-01-02 NOTE — Assessment & Plan Note (Signed)
Reports that she is doing well with current dose of sertraline.  We discussed options today and did review that she could take a total dose of medication once daily as opposed to splitting the skin to twice daily.  She would be interested in this for ease of administration.  Will allow for her to take 100 mg of sertraline once daily and monitor progress with this.  If she does prefer this dosing schedule and continues to have good control of symptoms, we can continue with work milligrams once daily, new prescription can be provided for this.  If however she feels that she does better with 50 mg twice daily, we can certainly continue with this dosing

## 2022-01-02 NOTE — Patient Instructions (Signed)
  Medication Instructions:  Your physician recommends that you continue on your current medications as directed. Please refer to the Current Medication list given to you today. --If you need a refill on any your medications before your next appointment, please call your pharmacy first. If no refills are authorized on file call the office.-- Lab Work: Your physician has recommended that you have lab work today: No If you have labs (blood work) drawn today and your tests are completely normal, you will receive your results via MyChart message OR a phone call from our staff.  Please ensure you check your voicemail in the event that you authorized detailed messages to be left on a delegated number. If you have any lab test that is abnormal or we need to change your treatment, we will call you to review the results.  Referrals/Procedures/Imaging: No  Follow-Up: Your next appointment:   Your physician recommends that you schedule a follow-up appointment in: 1-2 months with Dr. de Cuba.  You will receive a text message or e-mail with a link to a survey about your care and experience with us today! We would greatly appreciate your feedback!   Thanks for letting us be apart of your health journey!!  Primary Care and Sports Medicine   Dr. Raymond de Cuba   We encourage you to activate your patient portal called "MyChart".  Sign up information is provided on this After Visit Summary.  MyChart is used to connect with patients for Virtual Visits (Telemedicine).  Patients are able to view lab/test results, encounter notes, upcoming appointments, etc.  Non-urgent messages can be sent to your provider as well. To learn more about what you can do with MyChart, please visit --  [email or web address].    

## 2022-01-02 NOTE — Progress Notes (Signed)
New Patient Office Visit  Subjective    Patient ID: Nancy Welch, female    DOB: 10-20-1975  Age: 46 y.o. MRN: [id number]  CC:  Chief Complaint  Patient presents with   New Patient (Initial Visit)    Pt here to establish new care    HPI Nancy Welch presents to establish care Last PCP - Dr. Jeanice Lim [facility]), then went to [facility] family medicine  Bicuspid aortic valve, thoracic aortic aneurysm, hypertension: Follows with cardiology.  Current medications include losartan, metoprolol.  She will have MR imaging intermittently for monitoring of aneurysm.  Hyperlipidemia: Currently prescribed rosuvastatin, reports tolerating medication without issue, no reported myalgias. Managed by cardiology.  Depression and anxiety: Current medications include sertraline.  She has been taking sertraline 50 mg tablet twice daily.  Unclear indication as to reason for twice daily dosing.  She does feel that symptoms have been well-controlled with current regimen.  She indicates that she for started this related to postpartum anxiety that she was experiencing and has remained on that medication since  Patient is originally from [place], raised in [place]. 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 She enjoys spending time with family - has 2 kids, 14 and 6. She enjoys going to the zoo. Used to run more.  Outpatient Encounter Medications as of 01/02/2022  Medication Sig   diphenhydrAMINE (BENADRYL) 25 mg capsule Take 25 mg by mouth every 6 (six) hours as needed.   EPINEPHrine 0.3 mg/0.3 mL IJ SOAJ injection Inject 0.3 mg into the muscle as needed for anaphylaxis.   losartan (COZAAR) 25 MG tablet Take 1 tablet (25 mg total) by mouth daily.   metoprolol succinate (TOPROL-XL) 25 MG 24 hr tablet TAKE 1 TABLET BY MOUTH TWICE A DAY   Multiple Vitamins-Minerals (MULTIVITAMIN WITH MINERALS) tablet Take 1 tablet by mouth daily.   rosuvastatin (CRESTOR) 5 MG tablet TAKE 1 TABLET DAILY    sertraline (ZOLOFT) 50 MG tablet Take 1 tablet (50 mg total) by mouth 2 (two) times daily.   [DISCONTINUED] traMADol (ULTRAM) 50 MG tablet Take 1 tablet (50 mg total) by mouth every 8 (eight) hours as needed.   diazepam (VALIUM) 2 MG tablet Take 2 mg (1 tablet) immediately before MRI when instructed to do so by radiology staff. (Patient not taking: Reported on 01/02/2022)   traMADol (ULTRAM) 50 MG tablet Take 1 tablet (50 mg total) by mouth every 8 (eight) hours as needed.   No facility-administered encounter medications on file as of 01/02/2022.    Past Medical History:  Diagnosis Date   Anxiety    Bicuspid aortic valve    a. 2001 Echo ([place]): bicuspid AoV reportedly noted; b. 09/2018 Echo: EF 60-65%, Nl RV fxn, mild TR; c. 01/2018 cMR: EF 61%, RVEF 50%. bicuspid AoV w/o significant stenosis or regurg. Mildly dil asc Ao - 4.2cm.   Depression    Dilation of thoracic aorta (HCC)    a.10/2018 cMR: Asc Ao 4.2cm; b. 11/2019 MRA Chest: Aneurysmal dzs of Asc thoracic Ao - max diam 4.3-4.4cm.   Heart murmur    Panic attacks    PVC's (premature ventricular contractions)    SBE (subacute bacterial endocarditis) prophylaxis candidate     Past Surgical History:  Procedure Laterality Date   BREAST CYST ASPIRATION Right 2007   DILATION AND EVACUATION N/A 12/31/2012   Procedure: DILATATION AND EVACUATION;  Surgeon: Tilda Burrow, MD;  Location: [facility];  Service: Gynecology;  Laterality: N/A;  ovarian cyst removed     L side     Family History  Problem Relation Age of Onset   Hypertension Mother    Uterine cancer Mother    Squamous cell carcinoma Mother    Breast cancer Paternal Grandmother        33's   Arrhythmia Sister    Lupus Sister    Rheum arthritis Sister    Heart attack Paternal Grandfather     Social History   Socioeconomic History   Marital status: Married    Spouse name: Not on file   Number of children: 2   Years of education: Not on file   Highest education level:  Not on file  Occupational History   Not on file  Tobacco Use   Smoking status: Former   Smokeless tobacco: Never   Tobacco comments:    smoked 1/2 ppd for 5 years; quit in 2005   Vaping Use   Vaping Use: Never used  Substance and Sexual Activity   Alcohol use: Yes    Comment: 1-2 glasses of red wine per week   Drug use: Never   Sexual activity: Yes    Birth control/protection: None, Condom  Other Topics Concern   Not on file  Social History Narrative   Married; full time - lab tech; 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    Social Determinants of Health   Financial Resource Strain: Not on file  Food Insecurity: Not on file  Transportation Needs: Not on file  Physical Activity: Not on file  Stress: Not on file  Social Connections: Not on file  Intimate Partner Violence: Not on file    Objective    BP (!) 138/92 (BP Location: Left Arm, Patient Position: Sitting, Cuff Size: Large)   Pulse 65   Ht 5\' 10"  (1.778 m)   Wt 163 lb 1.6 oz (74 kg)   SpO2 100%   BMI 23.40 kg/m   Physical Exam  46 year old female in no acute distress Cardiovascular Sam with regular rate and rhythm Lungs clear to auscultation bilaterally  Assessment & Plan:   Problem List Items Addressed This Visit       Cardiovascular and Mediastinum   Essential hypertension - Primary    Blood pressure borderline in office today.  Has been previously well-controlled with current regimen, thus do not feel any changes are needed right now Recommend intermittent monitoring of blood pressure at home, DASH diet Recommend regular follow-up with cardiology as scheduled        Other   Anxiety    Reports that she is doing well with current dose of sertraline.  We discussed options today and did review that she could take a total dose of medication once daily as opposed to splitting the skin to twice daily.  She would be interested in this for ease of administration.  Will allow for her to take 100 mg  of sertraline once daily and monitor progress with this.  If she does prefer this dosing schedule and continues to have good control of symptoms, we can continue with work milligrams once daily, new prescription can be provided for this.  If however she feels that she does better with 50 mg twice daily, we can certainly continue with this dosing      Other Visit Diagnoses     Wellness examination       Relevant Orders   CBC with Differential/Platelet   Hemoglobin A1c   Comprehensive metabolic panel  Lipid panel   TSH Rfx on Abnormal to Free T4       Return in about 6 weeks (around 02/13/2022).   Nancy Beaufort J De Peru, MD

## 2022-01-02 NOTE — Assessment & Plan Note (Signed)
Blood pressure borderline in office today.  Has been previously well-controlled with current regimen, thus do not feel any changes are needed right now Recommend intermittent monitoring of blood pressure at home, DASH diet Recommend regular follow-up with cardiology as scheduled

## 2022-01-20 ENCOUNTER — Other Ambulatory Visit: Payer: Self-pay | Admitting: Internal Medicine

## 2022-02-06 ENCOUNTER — Ambulatory Visit (HOSPITAL_BASED_OUTPATIENT_CLINIC_OR_DEPARTMENT_OTHER): Payer: Managed Care, Other (non HMO)

## 2022-02-06 DIAGNOSIS — Z0000 Encounter for general adult medical examination without abnormal findings: Secondary | ICD-10-CM

## 2022-02-07 LAB — HEMOGLOBIN A1C
Est. average glucose Bld gHb Est-mCnc: 120 mg/dL
Hgb A1c MFr Bld: 5.8 % — ABNORMAL HIGH (ref 4.8–5.6)

## 2022-02-07 LAB — CBC WITH DIFFERENTIAL/PLATELET
Basophils Absolute: 0 10*3/uL (ref 0.0–0.2)
Basos: 1 %
EOS (ABSOLUTE): 0.3 10*3/uL (ref 0.0–0.4)
Eos: 5 %
Hematocrit: 39.8 % (ref 34.0–46.6)
Hemoglobin: 13.2 g/dL (ref 11.1–15.9)
Immature Grans (Abs): 0 10*3/uL (ref 0.0–0.1)
Immature Granulocytes: 0 %
Lymphocytes Absolute: 2 10*3/uL (ref 0.7–3.1)
Lymphs: 33 %
MCH: 29.6 pg (ref 26.6–33.0)
MCHC: 33.2 g/dL (ref 31.5–35.7)
MCV: 89 fL (ref 79–97)
Monocytes Absolute: 0.6 10*3/uL (ref 0.1–0.9)
Monocytes: 10 %
Neutrophils Absolute: 3 10*3/uL (ref 1.4–7.0)
Neutrophils: 51 %
Platelets: 259 10*3/uL (ref 150–450)
RBC: 4.46 x10E6/uL (ref 3.77–5.28)
RDW: 12.3 % (ref 11.7–15.4)
WBC: 5.9 10*3/uL (ref 3.4–10.8)

## 2022-02-07 LAB — LIPID PANEL
Chol/HDL Ratio: 2.1 ratio (ref 0.0–4.4)
Cholesterol, Total: 186 mg/dL (ref 100–199)
HDL: 88 mg/dL (ref >39)
LDL Chol Calc (NIH): 85 mg/dL (ref 0–99)
Triglycerides: 72 mg/dL (ref 0–149)
VLDL Cholesterol Cal: 13 mg/dL (ref 5–40)

## 2022-02-07 LAB — COMPREHENSIVE METABOLIC PANEL
ALT: 13 IU/L (ref 0–32)
AST: 18 IU/L (ref 0–40)
Albumin/Globulin Ratio: 2 (ref 1.2–2.2)
Albumin: 4.6 g/dL (ref 3.9–4.9)
Alkaline Phosphatase: 67 IU/L (ref 44–121)
BUN/Creatinine Ratio: 12 (ref 9–23)
BUN: 9 mg/dL (ref 6–24)
Bilirubin Total: 1.2 mg/dL (ref 0.0–1.2)
CO2: 24 mmol/L (ref 20–29)
Calcium: 9.5 mg/dL (ref 8.7–10.2)
Chloride: 98 mmol/L (ref 96–106)
Creatinine, Ser: 0.73 mg/dL (ref 0.57–1.00)
Globulin, Total: 2.3 g/dL (ref 1.5–4.5)
Glucose: 87 mg/dL (ref 70–99)
Potassium: 4.1 mmol/L (ref 3.5–5.2)
Sodium: 134 mmol/L (ref 134–144)
Total Protein: 6.9 g/dL (ref 6.0–8.5)
eGFR: 103 mL/min/{1.73_m2} (ref >59)

## 2022-02-07 LAB — TSH RFX ON ABNORMAL TO FREE T4: TSH: 1.35 u[IU]/mL (ref 0.450–4.500)

## 2022-02-13 ENCOUNTER — Ambulatory Visit (INDEPENDENT_AMBULATORY_CARE_PROVIDER_SITE_OTHER): Payer: Managed Care, Other (non HMO) | Admitting: Family Medicine

## 2022-02-13 VITALS — BP 126/89 | HR 79 | Ht 70.0 in | Wt 164.1 lb

## 2022-02-13 DIAGNOSIS — Z1211 Encounter for screening for malignant neoplasm of colon: Secondary | ICD-10-CM | POA: Diagnosis not present

## 2022-02-13 DIAGNOSIS — Z0000 Encounter for general adult medical examination without abnormal findings: Secondary | ICD-10-CM | POA: Diagnosis not present

## 2022-02-13 DIAGNOSIS — R7303 Prediabetes: Secondary | ICD-10-CM | POA: Insufficient documentation

## 2022-02-13 MED ORDER — SERTRALINE HCL 100 MG PO TABS: 100.0000 mg | ORAL_TABLET | Freq: Every day | ORAL | 1 refills | Status: DC

## 2022-02-13 NOTE — Progress Notes (Signed)
Subjective:    CC: Annual Physical Exam  HPI:  Nancy Welch is a 47 y.o. presenting for annual physical  I reviewed the past medical history, family history, social history, surgical history, and allergies today and no changes were needed.  Please see the problem list section below in epic for further details.  Past Medical History: Past Medical History:  Diagnosis Date   Anxiety    Bicuspid aortic valve    a. 2001 Echo ([place]): bicuspid AoV reportedly noted; b. 09/2018 Echo: EF 60-65%, Nl RV fxn, mild TR; c. 01/2018 cMR: EF 61%, RVEF 50%. bicuspid AoV w/o significant stenosis or regurg. Mildly dil asc Ao - 4.2cm.   Depression    Dilation of thoracic aorta ([place])    a.10/2018 cMR: Asc Ao 4.2cm; b. 11/2019 MRA Chest: Aneurysmal dzs of Asc thoracic Ao - max diam 4.3-4.4cm.   Heart murmur    Panic attacks    PVC's (premature ventricular contractions)    SBE (subacute bacterial endocarditis) prophylaxis candidate    Past Surgical History: Past Surgical History:  Procedure Laterality Date   BREAST CYST ASPIRATION Right 2007   DILATION AND EVACUATION N/A 12/31/2012   Procedure: DILATATION AND EVACUATION;  Surgeon: Jonnie Kind, MD;  Location: [facility];  Service: Gynecology;  Laterality: N/A;   ovarian cyst removed     L side    Social History: Social History   Socioeconomic History   Marital status: Married    Spouse name: Not on file   Number of children: 2   Years of education: Not on file   Highest education level: Not on file  Occupational History   Not on file  Tobacco Use   Smoking status: Former   Smokeless tobacco: Never   Tobacco comments:    smoked 1/2 ppd for 5 years; quit in 2005   Vaping Use   Vaping Use: Never used  Substance and Sexual Activity   Alcohol use: Yes    Comment: 1-2 glasses of red wine per week   Drug use: Never   Sexual activity: Yes    Birth control/protection: None, Condom  Other Topics Concern   Not on file  Social History  Narrative   Married; full time - lab tech; 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    Social Determinants of Health   Financial Resource Strain: Not on file  Food Insecurity: Not on file  Transportation Needs: Not on file  Physical Activity: Not on file  Stress: Not on file  Social Connections: Not on file   Family History: Family History  Problem Relation Age of Onset   Hypertension Mother    Uterine cancer Mother    Squamous cell carcinoma Mother    Breast cancer Paternal Grandmother        59's   Arrhythmia Sister    Lupus Sister    Rheum arthritis Sister    Heart attack Paternal Grandfather    Allergies: Allergies  Allergen Reactions   Escitalopram Oxalate Nausea Only    Dizziness/ vertigo.    Pork-Derived Products     Anaphylaxis    Medications: See med rec.  Review of Systems: No headache, visual changes, nausea, vomiting, diarrhea, constipation, dizziness, abdominal pain, skin rash, fevers, chills, night sweats, swollen lymph nodes, weight loss, chest pain, body aches, joint swelling, muscle aches, shortness of breath, mood changes, visual or auditory hallucinations.  Objective:    BP 126/89 (BP Location: Right Arm, Patient Position: Sitting, Cuff Size: Large)  Pulse 79   Ht 5\' 10"  (1.778 m)   Wt 164 lb 1.6 oz (74.4 kg)   SpO2 100%   BMI 23.55 kg/m   General: Well Developed, well nourished, and in no acute distress.  Neuro: Alert and oriented x3, extra-ocular muscles intact, sensation grossly intact. Cranial nerves II through XII are intact, motor, sensory, and coordinative functions are all intact. HEENT: Normocephalic, atraumatic, pupils equal round reactive to light, neck supple, no masses, no lymphadenopathy, thyroid nonpalpable. Oropharynx, nasopharynx, external ear canals are unremarkable. Skin: Warm and dry, no rashes noted. Cardiac: Regular rate and rhythm, no murmurs rubs or gallops. Respiratory: Clear to auscultation bilaterally. Not using  accessory muscles, speaking in full sentences. Abdominal: Soft, nontender, nondistended, positive bowel sounds, no masses, no organomegaly. Musculoskeletal: Shoulder, elbow, wrist, hip, knee, ankle stable, and with full range of motion.  Impression and Recommendations:    Wellness examination Routine HCM labs reviewed. HCM reviewed/discussed. Anticipatory guidance regarding healthy weight, lifestyle and choices given. Recommend healthy diet.  Recommend approximately 150 minutes/week of moderate intensity exercise Recommend regular dental and vision exams Always use seatbelt/lap and shoulder restraints Recommend using smoke alarms and checking batteries at least twice a year Recommend using sunscreen when outside Discussed colon cancer screening recommendations, options.  Patient would like to proceed with Cologuard Discussed tetanus immunization recommendations, patient is UTD  Return in about 6 months (around 08/14/2022) for preDM.  Patient would prefer to check hemoglobin A1c at time of next office visit   ___________________________________________ Nancy Fellman de [place], MD, ABFM, CAQSM Primary Care and [place]

## 2022-02-13 NOTE — Assessment & Plan Note (Addendum)
Routine HCM labs reviewed. HCM reviewed/discussed. Anticipatory guidance regarding healthy weight, lifestyle and choices given. Recommend healthy diet.  Recommend approximately 150 minutes/week of moderate intensity exercise Recommend regular dental and vision exams Always use seatbelt/lap and shoulder restraints Recommend using smoke alarms and checking batteries at least twice a year Recommend using sunscreen when outside Discussed colon cancer screening recommendations, options.  Patient would like to proceed with Cologuard Discussed tetanus immunization recommendations, patient is UTD

## 2022-02-14 ENCOUNTER — Other Ambulatory Visit (HOSPITAL_BASED_OUTPATIENT_CLINIC_OR_DEPARTMENT_OTHER): Payer: Self-pay | Admitting: Family Medicine

## 2022-03-18 LAB — COLOGUARD: COLOGUARD: NEGATIVE

## 2022-04-10 ENCOUNTER — Encounter (HOSPITAL_BASED_OUTPATIENT_CLINIC_OR_DEPARTMENT_OTHER): Payer: Self-pay

## 2022-06-11 ENCOUNTER — Other Ambulatory Visit: Payer: Self-pay | Admitting: Internal Medicine

## 2022-06-11 NOTE — Telephone Encounter (Signed)
Left voice mail to schedule appt

## 2022-06-11 NOTE — Telephone Encounter (Signed)
Patient needs appointment for further refills.  Thank you 

## 2022-07-25 ENCOUNTER — Other Ambulatory Visit (HOSPITAL_BASED_OUTPATIENT_CLINIC_OR_DEPARTMENT_OTHER): Payer: Self-pay | Admitting: Family Medicine

## 2022-07-25 NOTE — Telephone Encounter (Signed)
Please advise on refill request

## 2022-08-08 ENCOUNTER — Ambulatory Visit (HOSPITAL_BASED_OUTPATIENT_CLINIC_OR_DEPARTMENT_OTHER): Payer: Managed Care, Other (non HMO) | Admitting: Family Medicine

## 2022-08-20 ENCOUNTER — Encounter (HOSPITAL_BASED_OUTPATIENT_CLINIC_OR_DEPARTMENT_OTHER): Payer: Self-pay | Admitting: Family Medicine

## 2022-08-21 ENCOUNTER — Ambulatory Visit (HOSPITAL_BASED_OUTPATIENT_CLINIC_OR_DEPARTMENT_OTHER): Payer: Managed Care, Other (non HMO) | Admitting: Family Medicine

## 2022-08-21 ENCOUNTER — Encounter (HOSPITAL_BASED_OUTPATIENT_CLINIC_OR_DEPARTMENT_OTHER): Payer: Self-pay | Admitting: Family Medicine

## 2022-08-21 VITALS — BP 127/85 | HR 72 | Ht 70.0 in | Wt 154.3 lb

## 2022-08-21 DIAGNOSIS — Z0000 Encounter for general adult medical examination without abnormal findings: Secondary | ICD-10-CM

## 2022-08-21 DIAGNOSIS — R7303 Prediabetes: Secondary | ICD-10-CM

## 2022-08-21 LAB — POCT GLYCOSYLATED HEMOGLOBIN (HGB A1C): Hemoglobin A1C: 5.3 % (ref 4.0–5.6)

## 2022-08-21 NOTE — Progress Notes (Signed)
    Procedures performed today:    None.  Independent interpretation of notes and tests performed by another provider:   None.  Brief History, Exam, Impression, and Recommendations:    BP 127/85 (BP Location: Left Arm, Patient Position: Sitting, Cuff Size: Normal)   Pulse 72   Ht 5\' 10"  (1.778 m)   Wt 154 lb 4.8 oz (70 kg)   SpO2 100%   BMI 22.14 kg/m   Prediabetes Assessment & Plan: Patient presenting for follow-up of prediabetes.  Since last appointment, she has been working on lifestyle modifications, engaging in moderate or better exercise at least 3 days a week, is generally active on the days of the week both while at work as well as at home.  She does have kids at home and is generally active playing and keeping up with them.  By our documentation, she has lost about 10 pounds since last office visit.  She also reports that she has been making notable changes to dietary choices.  Overall, she feels that she is doing well.  She is due for hemoglobin A1c checked today for monitoring Hemoglobin A1c completed in office today shows notable improvement and is down to 5.3%. Congratulated patient on progress thus far, she has been doing great in regards to her lifestyle modifications with notable improvement in weight as well as improvement in hemoglobin A1c Will plan for follow-up in about 6 months for physical with baseline labs to be completed about 1 week prior  Orders: -     POCT glycosylated hemoglobin (Hb A1C)  Wellness examination -     CBC with Differential/Platelet; Future -     Comprehensive metabolic panel; Future -     Hemoglobin A1c; Future -     Lipid panel; Future -     TSH Rfx on Abnormal to Free T4; Future  Return in about 6 months (around 02/21/2023) for CPE with fasting labs 1 week prior.   ___________________________________________ Tayson Schnelle de [place], MD, ABFM, CAQSM Primary Care and Sports Medicine [facility]

## 2022-08-21 NOTE — Assessment & Plan Note (Signed)
Patient presenting for follow-up of prediabetes.  Since last appointment, she has been working on lifestyle modifications, engaging in moderate or better exercise at least 3 days a week, is generally active on the days of the week both while at work as well as at home.  She does have kids at home and is generally active playing and keeping up with them.  By our documentation, she has lost about 10 pounds since last office visit.  She also reports that she has been making notable changes to dietary choices.  Overall, she feels that she is doing well.  She is due for hemoglobin A1c checked today for monitoring Hemoglobin A1c completed in office today shows notable improvement and is down to 5.3%. Congratulated patient on progress thus far, she has been doing great in regards to her lifestyle modifications with notable improvement in weight as well as improvement in hemoglobin A1c Will plan for follow-up in about 6 months for physical with baseline labs to be completed about 1 week prior

## 2022-09-07 ENCOUNTER — Other Ambulatory Visit: Payer: Self-pay | Admitting: Internal Medicine

## 2022-09-12 ENCOUNTER — Other Ambulatory Visit: Payer: Self-pay | Admitting: Internal Medicine

## 2022-09-20 ENCOUNTER — Encounter: Payer: Self-pay | Admitting: Internal Medicine

## 2022-09-20 ENCOUNTER — Ambulatory Visit: Payer: Managed Care, Other (non HMO) | Attending: Internal Medicine | Admitting: Internal Medicine

## 2022-09-20 VITALS — BP 120/84 | HR 71 | Ht 70.0 in | Wt 153.8 lb

## 2022-09-20 DIAGNOSIS — I10 Essential (primary) hypertension: Secondary | ICD-10-CM

## 2022-09-20 DIAGNOSIS — Q231 Congenital insufficiency of aortic valve: Secondary | ICD-10-CM

## 2022-09-20 DIAGNOSIS — R072 Precordial pain: Secondary | ICD-10-CM | POA: Diagnosis not present

## 2022-09-20 DIAGNOSIS — I493 Ventricular premature depolarization: Secondary | ICD-10-CM

## 2022-09-20 DIAGNOSIS — I7121 Aneurysm of the ascending aorta, without rupture: Secondary | ICD-10-CM

## 2022-09-20 MED ORDER — METOPROLOL TARTRATE 50 MG PO TABS: ORAL_TABLET | ORAL | 0 refills | Status: DC

## 2022-09-20 NOTE — Progress Notes (Unsigned)
**Note Nancy-Identified via Obfuscation** Cardiology Office Note:  .   Date:  09/22/2022  ID:  Nancy Welch, DOB 12-Jun-1975, MRN [id number] PCP: Nancy Peru, Raymond J, MD  [facility] Providers Cardiologist:  Yvonne Kendall, MD     History of Present Illness: .   Nancy Welch is a 47 y.o. female with history of bicuspid aortic valve, thoracic aortic aneurysm, hypertension, depression, and anxiety, who presents for follow-up of bicuspid aortic valve and thoracic aortic aneurysm.  I last saw her in 02/2021, at which time she was feeling fairly well.  She continued to note sporadic chest pains happening a few times a week, though overall frequency had decreased.  Follow-up MRA in 10/2021 showed unchanged 4.4 cm aneurysm of the ascending aorta.  Today, Nancy Welch reports that she has been feeling fairly well, though she woke up earlier this week with anterior and left-sided chest pain.  This was new for her.  The discomfort came and went but ultimately resolved the next day.  Was different than the tightness she has felt in the past as well as the sensation she gets with PVCs.  She has continued to have intermittent tightness in her chest when she gets anxious.  She has been able to exercise without any symptoms.  She has not felt short of breath or lightheaded.  She has been monitoring her blood pressure at home and notes that her diastolic readings are typically 80-90 mmHg.  She also notes that her new PCP diagnosed her with prediabetes though her hemoglobin A1c has normalized with modest lifestyle modifications.  She believes her PVCs today may be due to increased caffeine intake.  ROS: See HPI  Studies Reviewed: Marland Kitchen   EKG Interpretation Date/Time:  Friday September 20 2022 16:27:26 EDT Ventricular Rate:  71 PR Interval:  144 QRS Duration:  88 QT Interval:  434 QTC Calculation: 471 R Axis:   4  Text Interpretation: Sinus rhythm with frequent Premature ventricular complexes and Premature atrial complexes Nonspecific T wave  abnormality When compared with ECG of 21-Mar-2021 Premature ventricular complexes are now Present Premature atrial complexes are now Present Confirmed by Jkayla Spiewak [phone]) on 09/22/2022 3:41:41 PM    MRA chest (10/28/2021): Relatively unchanged size and configuration of ascending aortic aneurysm measuring 4.4 cm.  TTE (11/16/2020): Normal LV size and wall thickness.  LVEF 50-55% with grade 2 diastolic dysfunction.  Global longitudinal strain -15.9%.  Normal RV size and function.  Mild left atrial enlargement.  Mild mitral regurgitation.  Mild tricuspid regurgitation.  Suboptimally visualized aortic valve (known to be bicuspid) with mean gradient 7 mmHg.  Dilated ascending aorta measuring 4.4 cm. Risk Assessment/Calculations:           Physical Exam:   VS:  BP 120/84   Pulse 71   Ht 5\' 10"  (1.778 m)   Wt 153 lb 12.8 oz (69.8 kg)   SpO2 100%   BMI 22.07 kg/m    Wt Readings from Last 3 Encounters:  09/20/22 153 lb 12.8 oz (69.8 kg)  08/21/22 154 lb 4.8 oz (70 kg)  02/13/22 164 lb 1.6 oz (74.4 kg)    General:  NAD.  Accompanied by her husband. Neck: No JVD or HJR. Lungs: Clear to auscultation bilaterally without wheezes or crackles. Heart: Regular rate and rhythm with occasional extrasystoles and 1/6 systolic murmur. Abdomen: Soft, nontender, nondistended. Extremities: No lower extremity edema.  ASSESSMENT AND PLAN: .    Bicuspid aortic valve and thoracic aortic aneurysm: We will obtain a coronary CTA to  include the thoracic aorta for evaluation of chest pain and follow-up of mildly dilated thoracic aorta.  We will also obtain an echocardiogram at Nancy Welch's convenience.  Chest pain: Nancy Welch continues to have sporadic chest pain, sometimes consisting of pressure associated with anxiety but also a more recent sharp pain that came and went for several hours.  Her cardiac risk factors are limited, I think it would be prudent to exclude underlying CAD, particularly since follow-up  of her thoracic aortic aneurysm with cross-sectional imaging is planned.  We will obtain a coronary CTA for further assessment.  I have asked her to minimize her caffeine intake so as to decrease the risk for PACs and PVCs that may affect gating of the test.  PVCs: Nancy Welch has had fairly mild symptoms but is noted to have frequent PVCs today.  She endorses increased caffeine intake that may be contributing.  I have encouraged her to cut down on her caffeine consumption.  Will continue with low-dose metoprolol.  Hypertension: Diastolic blood pressures have been borderline.  We will defer escalation of losartan today, though this would be the next step if her diastolic readings continue to be at or above 90.     Dispo: Return to clinic in 1 year, sooner if echo or CTA show significant abnormalities.  Signed, Yvonne Kendall, MD

## 2022-09-20 NOTE — Patient Instructions (Addendum)
Medication Instructions:  Your physician recommends that you continue on your current medications as directed. Please refer to the Current Medication list given to you today.   *If you need a refill on your cardiac medications before your next appointment, please call your pharmacy*   Lab Work: Your provider would like for you to return in 1 week prior to test to have the following labs drawn: (BMP).   Please go to [facility] [address] ([facility]) [address], [place] [address] You do not need an appointment.  They are open from 7:30 am-4 pm.  Lunch from 1:00 pm- 2:00 pm You will not need to be fasting.    Testing/Procedures: Your physician has requested that you have an echocardiogram. Echocardiography is a painless test that uses sound waves to create images of your heart. It provides your doctor with information about the size and shape of your heart and how well your heart's chambers and valves are working.   You may receive an ultrasound enhancing agent through an IV if needed to better visualize your heart during the echo. This procedure takes approximately one hour.  There are no restrictions for this procedure.  This will take place at [address] ([facility]) [phone], [place] [address]   Cardiac CT Angiography (CTA), is a special type of CT scan that uses a computer to produce multi-dimensional views of major blood vessels throughout the body. In CT angiography, a contrast material is injected through an IV to help visualize the blood vessels  Please see instructions below  Follow-Up: At [facility], you and your health needs are our priority.  As part of our continuing mission to provide you with exceptional heart care, we have created designated Provider Care Teams.  These Care Teams include your primary Cardiologist (physician) and Advanced Practice Providers (APPs -  Physician Assistants and Nurse Practitioners) who all  work together to provide you with the care you need, when you need it.  We recommend signing up for the patient portal called "MyChart".  Sign up information is provided on this After Visit Summary.  MyChart is used to connect with patients for Virtual Visits (Telemedicine).  Patients are able to view lab/test results, encounter notes, upcoming appointments, etc.  Non-urgent messages can be sent to your provider as well.   To learn more about what you can do with MyChart, go to [email or web address].    Your next appointment:   1 year(s)  Provider:   You may see Yvonne Kendall, MD or one of the following Advanced Practice Providers on your designated Care Team:   Nicolasa Ducking, NP Eula Listen, PA-C Cadence Fransico Michael, PA-C Charlsie Quest, NP      Your cardiac CT will be scheduled at one of the below locations:    [facility] [address] [place], [place] [address] [phone]  OR   [facility] [address] [place], [place] [address] [phone]  If scheduled at [facility], please arrive at the [facility] and Children's Entrance (Entrance C2) of [facility] 30 minutes prior to test start time. You can use the FREE valet parking offered at entrance C (encouraged to control the heart rate for the test)  Proceed to the [facility] Radiology Department (first floor) to check-in and test prep.  All radiology patients and guests should use entrance C2 at [facility], accessed from [facility], even though the hospital's  physical address listed is [address]    If scheduled at [facility] or [facility], please arrive 15 mins early for check-in and test prep.  There is spacious parking and easy access to the radiology department from the [facility] entrance. Please enter here and check-in with the desk  attendant.   Please follow these instructions carefully (unless otherwise directed):  An IV will be required for this test and Nitroglycerin will be given.   On the Night Before the Test: Be sure to Drink plenty of water. Do not consume any caffeinated/decaffeinated beverages or chocolate 12 hours prior to your test. Do not take any antihistamines 12 hours prior to your test.  On the Day of the Test: Drink plenty of water until 1 hour prior to the test. Do not eat any food 1 hour prior to test. You may take your regular medications prior to the test.  HOLD Metoprolol Succinate 25 mg and take metoprolol (Lopressor) 50 mg two hours prior to test. If you take Furosemide/Hydrochlorothiazide/Spironolactone, please HOLD on the morning of the test. FEMALES- please wear underwire-free bra if available, avoid dresses & tight clothing       After the Test: Drink plenty of water. After receiving IV contrast, you may experience a mild flushed feeling. This is normal. On occasion, you may experience a mild rash up to 24 hours after the test. This is not dangerous. If this occurs, you can take Benadryl 25 mg and increase your fluid intake. If you experience trouble breathing, this can be serious. If it is severe call 911 IMMEDIATELY. If it is mild, please call our office. If you take any of these medications: Glipizide/Metformin, Avandament, Glucavance, please do not take 48 hours after completing test unless otherwise instructed.  We will call to schedule your test 2-4 weeks out understanding that some insurance companies will need an authorization prior to the service being performed.   For more information and frequently asked questions, please visit our website : [email or web address]  For non-scheduling related questions, please contact the cardiac imaging nurse navigator should you have any questions/concerns: Cardiac Imaging Nurse Navigators Direct Office Dial: [phone]    For scheduling needs, including cancellations and rescheduling, please call [place], [phone].

## 2022-09-22 DIAGNOSIS — R072 Precordial pain: Secondary | ICD-10-CM | POA: Insufficient documentation

## 2022-10-02 ENCOUNTER — Telehealth (HOSPITAL_COMMUNITY): Payer: Self-pay | Admitting: Emergency Medicine

## 2022-10-02 NOTE — Telephone Encounter (Signed)
Reaching out to patient to offer assistance regarding upcoming cardiac imaging study; pt verbalizes understanding of appt date/time, parking situation and where to check in, pre-test NPO status and medications ordered, and verified current allergies; name and call back number provided for further questions should they arise Sara Wallace RN Navigator Cardiac Imaging [facility] [phone] office [phone] cell 

## 2022-10-03 ENCOUNTER — Ambulatory Visit
Admission: RE | Admit: 2022-10-03 | Discharge: 2022-10-03 | Disposition: A | Discharge status: Home / Self Care | Payer: Managed Care, Other (non HMO) | Source: Ambulatory Visit | Attending: Internal Medicine | Admitting: Internal Medicine

## 2022-10-03 DIAGNOSIS — R072 Precordial pain: Secondary | ICD-10-CM | POA: Diagnosis present

## 2022-10-03 DIAGNOSIS — I7121 Aneurysm of the ascending aorta, without rupture: Secondary | ICD-10-CM | POA: Insufficient documentation

## 2022-10-03 MED ORDER — NITROGLYCERIN 0.4 MG SL SUBL
0.8000 mg | SUBLINGUAL_TABLET | Freq: Once | SUBLINGUAL | Status: AC
  Administered 2022-10-03: 0.8 mg via SUBLINGUAL

## 2022-10-03 MED ORDER — METOPROLOL TARTRATE 5 MG/5ML IV SOLN
10.0000 mg | Freq: Once | INTRAVENOUS | Status: AC | PRN
  Administered 2022-10-03: 10 mg via INTRAVENOUS

## 2022-10-03 MED ORDER — SODIUM CHLORIDE 0.9 % IV BOLUS
150.0000 mL | Freq: Once | INTRAVENOUS | Status: AC
  Administered 2022-10-03: 150 mL via INTRAVENOUS

## 2022-10-03 MED ORDER — IOHEXOL 350 MG/ML SOLN
75.0000 mL | Freq: Once | INTRAVENOUS | Status: AC | PRN
  Administered 2022-10-03: 75 mL via INTRAVENOUS

## 2022-10-03 NOTE — Progress Notes (Signed)
Patient tolerated CT well. Vital signs stable encourage to drink water throughout day.Reasons explained and verbalized understanding. Ambulated steady gait.   

## 2022-10-15 ENCOUNTER — Other Ambulatory Visit: Payer: Managed Care, Other (non HMO)

## 2022-10-22 ENCOUNTER — Ambulatory Visit: Payer: Managed Care, Other (non HMO) | Attending: Internal Medicine

## 2022-10-22 DIAGNOSIS — Q231 Congenital insufficiency of aortic valve: Secondary | ICD-10-CM

## 2022-10-22 DIAGNOSIS — I350 Nonrheumatic aortic (valve) stenosis: Secondary | ICD-10-CM | POA: Diagnosis not present

## 2022-10-22 LAB — ECHOCARDIOGRAM COMPLETE
AR max vel: 2.05 cm2
AV Area VTI: 2.02 cm2
AV Area mean vel: 1.98 cm2
AV Mean grad: 9.8 mmHg
AV Peak grad: 17.6 mmHg
Ao pk vel: 2.1 m/s
Area-P 1/2: 3.12 cm2
Calc EF: 48.4 %
S' Lateral: 3.9 cm
Single Plane A2C EF: 50.6 %
Single Plane A4C EF: 48.4 %

## 2022-12-08 ENCOUNTER — Other Ambulatory Visit: Payer: Self-pay | Admitting: Internal Medicine

## 2023-01-26 ENCOUNTER — Other Ambulatory Visit: Payer: Self-pay | Admitting: Internal Medicine

## 2023-01-26 ENCOUNTER — Other Ambulatory Visit (HOSPITAL_BASED_OUTPATIENT_CLINIC_OR_DEPARTMENT_OTHER): Payer: Self-pay | Admitting: Family Medicine

## 2023-02-13 ENCOUNTER — Other Ambulatory Visit (HOSPITAL_BASED_OUTPATIENT_CLINIC_OR_DEPARTMENT_OTHER): Payer: Managed Care, Other (non HMO)

## 2023-02-13 DIAGNOSIS — Z0000 Encounter for general adult medical examination without abnormal findings: Secondary | ICD-10-CM

## 2023-02-14 LAB — COMPREHENSIVE METABOLIC PANEL
ALT: 11 [IU]/L (ref 0–32)
AST: 18 [IU]/L (ref 0–40)
Albumin: 4.4 g/dL (ref 3.9–4.9)
Alkaline Phosphatase: 74 [IU]/L (ref 44–121)
BUN/Creatinine Ratio: 12 (ref 9–23)
BUN: 9 mg/dL (ref 6–24)
Bilirubin Total: 0.7 mg/dL (ref 0.0–1.2)
CO2: 23 mmol/L (ref 20–29)
Calcium: 9.4 mg/dL (ref 8.7–10.2)
Chloride: 97 mmol/L (ref 96–106)
Creatinine, Ser: 0.76 mg/dL (ref 0.57–1.00)
Globulin, Total: 2.4 g/dL (ref 1.5–4.5)
Glucose: 88 mg/dL (ref 70–99)
Potassium: 4.2 mmol/L (ref 3.5–5.2)
Sodium: 133 mmol/L — ABNORMAL LOW (ref 134–144)
Total Protein: 6.8 g/dL (ref 6.0–8.5)
eGFR: 97 mL/min/{1.73_m2} (ref >59)

## 2023-02-14 LAB — CBC WITH DIFFERENTIAL/PLATELET
Basophils Absolute: 0.1 10*3/uL (ref 0.0–0.2)
Basos: 1 %
EOS (ABSOLUTE): 0.3 10*3/uL (ref 0.0–0.4)
Eos: 5 %
Hematocrit: 40.6 % (ref 34.0–46.6)
Hemoglobin: 12.9 g/dL (ref 11.1–15.9)
Immature Grans (Abs): 0 10*3/uL (ref 0.0–0.1)
Immature Granulocytes: 0 %
Lymphocytes Absolute: 1.8 10*3/uL (ref 0.7–3.1)
Lymphs: 27 %
MCH: 29.1 pg (ref 26.6–33.0)
MCHC: 31.8 g/dL (ref 31.5–35.7)
MCV: 92 fL (ref 79–97)
Monocytes Absolute: 0.7 10*3/uL (ref 0.1–0.9)
Monocytes: 12 %
Neutrophils Absolute: 3.5 10*3/uL (ref 1.4–7.0)
Neutrophils: 55 %
Platelets: 307 10*3/uL (ref 150–450)
RBC: 4.43 x10E6/uL (ref 3.77–5.28)
RDW: 11.8 % (ref 11.7–15.4)
WBC: 6.4 10*3/uL (ref 3.4–10.8)

## 2023-02-14 LAB — LIPID PANEL
Chol/HDL Ratio: 2.2 {ratio} (ref 0.0–4.4)
Cholesterol, Total: 163 mg/dL (ref 100–199)
HDL: 74 mg/dL (ref >39)
LDL Chol Calc (NIH): 78 mg/dL (ref 0–99)
Triglycerides: 52 mg/dL (ref 0–149)
VLDL Cholesterol Cal: 11 mg/dL (ref 5–40)

## 2023-02-14 LAB — TSH RFX ON ABNORMAL TO FREE T4: TSH: 0.966 u[IU]/mL (ref 0.450–4.500)

## 2023-02-14 LAB — HEMOGLOBIN A1C
Est. average glucose Bld gHb Est-mCnc: 117 mg/dL
Hgb A1c MFr Bld: 5.7 % — ABNORMAL HIGH (ref 4.8–5.6)

## 2023-02-19 ENCOUNTER — Encounter (HOSPITAL_BASED_OUTPATIENT_CLINIC_OR_DEPARTMENT_OTHER): Payer: Self-pay | Admitting: Family Medicine

## 2023-02-20 ENCOUNTER — Encounter (HOSPITAL_BASED_OUTPATIENT_CLINIC_OR_DEPARTMENT_OTHER): Payer: Self-pay | Admitting: Family Medicine

## 2023-02-20 ENCOUNTER — Ambulatory Visit (INDEPENDENT_AMBULATORY_CARE_PROVIDER_SITE_OTHER): Payer: Managed Care, Other (non HMO) | Admitting: Family Medicine

## 2023-02-20 VITALS — BP 125/101 | HR 76 | Ht 70.0 in | Wt 155.4 lb

## 2023-02-20 DIAGNOSIS — Z0000 Encounter for general adult medical examination without abnormal findings: Secondary | ICD-10-CM

## 2023-02-20 MED ORDER — TRAMADOL HCL 50 MG PO TABS: 50.0000 mg | ORAL_TABLET | Freq: Three times a day (TID) | ORAL | 0 refills | Status: AC | PRN

## 2023-02-20 NOTE — Assessment & Plan Note (Signed)
Routine HCM labs reviewed. HCM reviewed/discussed. Anticipatory guidance regarding healthy weight, lifestyle and choices given. Recommend healthy diet.  Recommend approximately 150 minutes/week of moderate intensity exercise Recommend regular dental and vision exams Always use seatbelt/lap and shoulder restraints Recommend using smoke alarms and checking batteries at least twice a year Recommend using sunscreen when outside Discussed colon cancer screening recommendations, options.  Patient is UTD Discussed tetanus immunization recommendations, patient is UTD

## 2023-02-20 NOTE — Patient Instructions (Signed)
  Medication Instructions:  Your physician recommends that you continue on your current medications as directed. Please refer to the Current Medication list given to you today. --If you need a refill on any your medications before your next appointment, please call your pharmacy first. If no refills are authorized on file call the office.-- Lab Work: Your physician has recommended that you have lab work today: no If you have labs (blood work) drawn today and your tests are completely normal, you will receive your results via MyChart message OR a phone call from our staff.  Please ensure you check your voicemail in the event that you authorized detailed messages to be left on a delegated number. If you have any lab test that is abnormal or we need to change your treatment, we will call you to review the results.    Follow-Up: Your next appointment:   Your physician recommends that you schedule a follow-up appointment in: 1 year follow up for physical with Dr. de [place]  You will receive a text message or e-mail with a link to a survey about your care and experience with [place] today! We would greatly appreciate your feedback!   Thanks for letting us be apart of your health journey!!  Primary Care and Sports Medicine   Dr. Ceasar Mons Peru   We encourage you to activate your patient portal called "MyChart".  Sign up information is provided on this After Visit Summary.  MyChart is used to connect with patients for Virtual Visits (Telemedicine).  Patients are able to view lab/test results, encounter notes, upcoming appointments, etc.  Non-urgent messages can be sent to your provider as well. To learn more about what you can do with MyChart, please visit --  [email or web address].

## 2023-02-20 NOTE — Progress Notes (Signed)
Subjective:    CC: Annual Physical Exam  HPI: Nancy Welch is a 48 y.o. presenting for annual physical  I reviewed the past medical history, family history, social history, surgical history, and allergies today and no changes were needed.  Please see the problem list section below in epic for further details.  Past Medical History: Past Medical History:  Diagnosis Date   Anxiety    Bicuspid aortic valve    a. 2001 Echo ([place]): bicuspid AoV reportedly noted; b. 09/2018 Echo: EF 60-65%, Nl RV fxn, mild TR; c. 01/2018 cMR: EF 61%, RVEF 50%. bicuspid AoV w/o significant stenosis or regurg. Mildly dil asc Ao - 4.2cm.   Depression    Dilation of thoracic aorta (HCC)    a.10/2018 cMR: Asc Ao 4.2cm; b. 11/2019 MRA Chest: Aneurysmal dzs of Asc thoracic Ao - max diam 4.3-4.4cm.   Heart murmur    Panic attacks    PVC's (premature ventricular contractions)    Past Surgical History: Past Surgical History:  Procedure Laterality Date   BREAST CYST ASPIRATION Right 2007   DILATION AND EVACUATION N/A 12/31/2012   Procedure: DILATATION AND EVACUATION;  Surgeon: Tilda Burrow, MD;  Location: [facility];  Service: Gynecology;  Laterality: N/A;   ovarian cyst removed     L side    Social History: Social History   Socioeconomic History   Marital status: Married    Spouse name: Not on file   Number of children: 2   Years of education: Not on file   Highest education level: Bachelor's degree (e.g., BA, AB, BS)  Occupational History   Not on file  Tobacco Use   Smoking status: Former    Passive exposure: Past   Smokeless tobacco: Never   Tobacco comments:    smoked 1/2 ppd for 5 years; quit in 2005   Vaping Use   Vaping status: Never Used  Substance and Sexual Activity   Alcohol use: Yes    Comment: 1-2 glasses of red wine per week   Drug use: Never   Sexual activity: Yes    Birth control/protection: None, Condom  Other Topics Concern   Not on file  Social History Narrative    Married; full time - lab tech; 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    Social Drivers of Corporate investment banker Strain: Low Risk  (08/19/2022)   Overall Financial Resource Strain (CARDIA)    Difficulty of Paying Living Expenses: Not hard at all  Food Insecurity: No Food Insecurity (08/19/2022)   Hunger Vital Sign    Worried About Running Out of Food in the Last Year: Never true    Ran Out of Food in the Last Year: Never true  Transportation Needs: No Transportation Needs (08/19/2022)   PRAPARE - Administrator, Civil Service (Medical): No    Lack of Transportation (Non-Medical): No  Physical Activity: Insufficiently Active (08/19/2022)   Exercise Vital Sign    Days of Exercise per Week: 3 days    Minutes of Exercise per Session: 30 min  Stress: No Stress Concern Present (08/19/2022)   [employer] of Occupational Health - Occupational Stress Questionnaire    Feeling of Stress : Only a little  Social Connections: Unknown (08/19/2022)   Social Connection and Isolation Panel [NHANES]    Frequency of Communication with Friends and Family: Patient declined    Frequency of Social Gatherings with Friends and Family: Patient declined    Attends Religious Services: Patient  declined    Active Member of Clubs or Organizations: Patient declined    Attends Banker Meetings: Not on file    Marital Status: Married   Family History: Family History  Problem Relation Age of Onset   Hypertension Mother    Uterine cancer Mother    Squamous cell carcinoma Mother    Breast cancer Paternal Grandmother        59's   Arrhythmia Sister    Lupus Sister    Rheum arthritis Sister    Heart attack Paternal Grandfather    Allergies: Allergies  Allergen Reactions   Escitalopram Oxalate Nausea Only    Dizziness/ vertigo.    Pork-Derived Products     Anaphylaxis    Medications: See med rec.  Review of Systems: No headache, visual changes, nausea, vomiting,  diarrhea, constipation, dizziness, abdominal pain, skin rash, fevers, chills, night sweats, swollen lymph nodes, weight loss, chest pain, body aches, joint swelling, muscle aches, shortness of breath, mood changes, visual or auditory hallucinations.  Objective:    BP (!) 134/95 (BP Location: Right Arm, Patient Position: Sitting, Cuff Size: Normal)   Pulse 76   Ht 5\' 10"  (1.778 m)   Wt 155 lb 6.4 oz (70.5 kg)   LMP 01/31/2023 (Exact Date)   SpO2 100%   Breastfeeding No   BMI 22.30 kg/m   General: Well Developed, well nourished, and in no acute distress. Neuro: Alert and oriented x3, extra-ocular muscles intact, sensation grossly intact. Cranial nerves II through XII are intact, motor, sensory, and coordinative functions are all intact. HEENT: Normocephalic, atraumatic, pupils equal round reactive to light, neck supple, no masses, no lymphadenopathy, thyroid nonpalpable. Oropharynx, nasopharynx, external ear canals are unremarkable. Skin: Warm and dry, no rashes noted. Cardiac: Regular rate and rhythm Respiratory: Clear to auscultation bilaterally. Not using accessory muscles, speaking in full sentences. Abdominal: Soft, nontender, nondistended, positive bowel sounds, no masses, no organomegaly. Musculoskeletal: Shoulder, elbow, wrist, hip, knee, ankle stable, and with full range of motion.  Impression and Recommendations:    Wellness examination Assessment & Plan: Routine HCM labs reviewed. HCM reviewed/discussed. Anticipatory guidance regarding healthy weight, lifestyle and choices given. Recommend healthy diet.  Recommend approximately 150 minutes/week of moderate intensity exercise Recommend regular dental and vision exams Always use seatbelt/lap and shoulder restraints Recommend using smoke alarms and checking batteries at least twice a year Recommend using sunscreen when outside Discussed colon cancer screening recommendations, options.  Patient is UTD Discussed tetanus  immunization recommendations, patient is UTD   Other orders -     traMADol HCl; Take 1 tablet (50 mg total) by mouth every 8 (eight) hours as needed.  Dispense: 30 tablet; Refill: 0  Return in about 1 year (around 02/20/2024) for CPE.   ___________________________________________ Leocadio Heal de [place], MD, ABFM, CAQSM Primary Care and Sports Medicine [facility]

## 2023-03-24 ENCOUNTER — Encounter (HOSPITAL_BASED_OUTPATIENT_CLINIC_OR_DEPARTMENT_OTHER): Payer: Self-pay | Admitting: Family Medicine

## 2023-07-13 ENCOUNTER — Other Ambulatory Visit (HOSPITAL_BASED_OUTPATIENT_CLINIC_OR_DEPARTMENT_OTHER): Payer: Self-pay | Admitting: Family Medicine

## 2023-11-13 ENCOUNTER — Ambulatory Visit: Admitting: Obstetrics & Gynecology

## 2023-11-13 ENCOUNTER — Encounter: Payer: Self-pay | Admitting: Obstetrics & Gynecology

## 2023-11-13 ENCOUNTER — Other Ambulatory Visit (HOSPITAL_COMMUNITY)
Admission: RE | Admit: 2023-11-13 | Discharge: 2023-11-13 | Disposition: A | Discharge status: Home / Self Care | Source: Ambulatory Visit | Attending: Obstetrics & Gynecology | Admitting: Obstetrics & Gynecology

## 2023-11-13 VITALS — BP 132/94 | HR 58 | Ht 70.0 in | Wt 164.0 lb

## 2023-11-13 DIAGNOSIS — Z01419 Encounter for gynecological examination (general) (routine) without abnormal findings: Secondary | ICD-10-CM | POA: Insufficient documentation

## 2023-11-13 DIAGNOSIS — R102 Pelvic and perineal pain unspecified side: Secondary | ICD-10-CM

## 2023-11-13 NOTE — Progress Notes (Signed)
 Subjective:     Nancy Welch is a 48 y.o. female here for a routine exam.  Patient's last menstrual period was 11/08/2023. [id number] Birth Control Method:  vasectomy Menstrual Calendar(currently): regular  Current complaints: crampy pain between periods.   Current acute medical issues:  Bicuspid aortic valve   Recent Gynecologic History Patient's last menstrual period was 11/08/2023. Last Pap: 09/2018,  normal Last mammogram: 01/2022,  normal  Past Medical History:  Diagnosis Date   Allergy 2014   Pork   Anxiety 2009   Post partum   Bicuspid aortic valve    a. 2001 Echo ([place]): bicuspid AoV reportedly noted; b. 09/2018 Echo: EF 60-65%, Nl RV fxn, mild TR; c. 01/2018 cMR: EF 61%, RVEF 50%. bicuspid AoV w/o significant stenosis or regurg. Mildly dil asc Ao - 4.2cm.   Depression 2009   Post partum   Dilation of thoracic aorta    a.10/2018 cMR: Asc Ao 4.2cm; b. 11/2019 MRA Chest: Aneurysmal dzs of Asc thoracic Ao - max diam 4.3-4.4cm.   Heart murmur 1993   Hyperlipidemia 2021   Hypertension 2020   Panic attacks    PVC's (premature ventricular contractions)    Vaginal Pap smear, abnormal     Past Surgical History:  Procedure Laterality Date   BREAST CYST ASPIRATION Right 2007   COLPOSCOPY     DILATION AND EVACUATION N/A 12/31/2012   Procedure: DILATATION AND EVACUATION;  Surgeon: Norleen LULLA Server, MD;  Location: [facility];  Service: Gynecology;  Laterality: N/A;   ovarian cyst removed     L side     OB History     Gravida  5   Para  2   Term  2   Preterm      AB  3   Living  2      SAB  3   IAB      Ectopic      Multiple  0   Live Births  2           Social History   Socioeconomic History   Marital status: Married    Spouse name: Not on file   Number of children: 2   Years of education: Not on file   Highest education level: Bachelor's degree (e.g., BA, AB, BS)  Occupational History   Not on file  Tobacco Use   Smoking status: Former     Passive exposure: Past   Smokeless tobacco: Never   Tobacco comments:    smoked 1/2 ppd for 5 years; quit in 2005   Vaping Use   Vaping status: Never Used  Substance and Sexual Activity   Alcohol use: Yes    Comment: 1-2 glasses of red wine per week   Drug use: Never   Sexual activity: Yes    Birth control/protection: None, Other-see comments    Comment: vasectomy  Other Topics Concern   Not on file  Social History Narrative   Married; full time - lab tech; 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    Social Drivers of Corporate investment banker Strain: Low Risk  (11/13/2023)   Overall Financial Resource Strain (CARDIA)    Difficulty of Paying Living Expenses: Not very hard  Food Insecurity: No Food Insecurity (11/13/2023)   Hunger Vital Sign    Worried About Running Out of Food in the Last Year: Never true    Ran Out of Food in the Last Year: Never true  Transportation Needs: No Transportation Needs (  11/13/2023)   PRAPARE - Administrator, Civil Service (Medical): No    Lack of Transportation (Non-Medical): No  Physical Activity: Insufficiently Active (11/13/2023)   Exercise Vital Sign    Days of Exercise per Week: 3 days    Minutes of Exercise per Session: 30 min  Stress: No Stress Concern Present (11/13/2023)   [employer] of Occupational Health - Occupational Stress Questionnaire    Feeling of Stress: Only a little  Social Connections: Moderately Integrated (11/13/2023)   Social Connection and Isolation Panel    Frequency of Communication with Friends and Family: Once a week    Frequency of Social Gatherings with Friends and Family: Once a week    Attends Religious Services: 1 to 4 times per year    Active Member of [employer] or Organizations: Yes    Attends Banker Meetings: 1 to 4 times per year    Marital Status: Married    Family History  Problem Relation Age of Onset   Hypertension Mother    Uterine cancer Mother     Squamous cell carcinoma Mother    Breast cancer Paternal Grandmother        36's   Arrhythmia Sister    Lupus Sister    Rheum arthritis Sister    Heart attack Paternal Grandfather    Early death Paternal Grandfather      Current Outpatient Medications:    diphenhydrAMINE  (BENADRYL ) 25 mg capsule, Take 25 mg by mouth every 6 (six) hours as needed., Disp: , Rfl:    EPINEPHrine  0.3 mg/0.3 mL IJ SOAJ injection, Inject 0.3 mg into the muscle as needed for anaphylaxis., Disp: 1 each, Rfl: 0   losartan  (COZAAR ) 25 MG tablet, TAKE 1 TABLET (25 MG TOTAL) BY MOUTH DAILY., Disp: 90 tablet, Rfl: 3   metoprolol  succinate (TOPROL -XL) 25 MG 24 hr tablet, TAKE 1 TABLET BY MOUTH TWICE A DAY, Disp: 180 tablet, Rfl: 3   Multiple Vitamins-Minerals (MULTIVITAMIN WITH MINERALS) tablet, Take 1 tablet by mouth daily., Disp: 30 tablet, Rfl: 0   Omega-3 Fatty Acids (FISH OIL) 1200 MG CAPS, Take by mouth. Takes 2  a day, Disp: , Rfl:    rosuvastatin  (CRESTOR ) 5 MG tablet, TAKE 1 TABLET DAILY, Disp: 90 tablet, Rfl: 3   sertraline  (ZOLOFT ) 100 MG tablet, TAKE 1 TABLET DAILY, Disp: 90 tablet, Rfl: 1   traMADol  (ULTRAM ) 50 MG tablet, Take 1 tablet (50 mg total) by mouth every 8 (eight) hours as needed., Disp: 30 tablet, Rfl: 0  Review of Systems  Review of Systems  Constitutional: Negative for fever, chills, weight loss, malaise/fatigue and diaphoresis.  HENT: Negative for hearing loss, ear pain, nosebleeds, congestion, sore throat, neck pain, tinnitus and ear discharge.   Eyes: Negative for blurred vision, double vision, photophobia, pain, discharge and redness.  Respiratory: Negative for cough, hemoptysis, sputum production, shortness of breath, wheezing and stridor.   Cardiovascular: Negative for chest pain, palpitations, orthopnea, claudication, leg swelling and PND.  Gastrointestinal: negative for abdominal pain. Negative for heartburn, nausea, vomiting, diarrhea, constipation, blood in stool and melena.   Genitourinary: Negative for dysuria, urgency, frequency, hematuria and flank pain.  Musculoskeletal: Negative for myalgias, back pain, joint pain and falls.  Skin: Negative for itching and rash.  Neurological: Negative for dizziness, tingling, tremors, sensory change, speech change, focal weakness, seizures, loss of consciousness, weakness and headaches.  Endo/Heme/Allergies: Negative for environmental allergies and polydipsia. Does not bruise/bleed easily.  Psychiatric/Behavioral: Negative for depression, suicidal ideas, hallucinations,  memory loss and substance abuse. The patient is not nervous/anxious and does not have insomnia.        Objective:  Blood pressure (!) 132/94, pulse (!) 58, height 5' 10 (1.778 m), weight 164 lb (74.4 kg), last menstrual period 11/08/2023.   Physical Exam  Vitals reviewed. Constitutional: She is oriented to person, place, and time. She appears well-developed and well-nourished.  HENT:  Head: Normocephalic and atraumatic.        Right Ear: External ear normal.  Left Ear: External ear normal.  Nose: Nose normal.  Mouth/Throat: Oropharynx is clear and moist.  Eyes: Conjunctivae and EOM are normal. Pupils are equal, round, and reactive to light. Right eye exhibits no discharge. Left eye exhibits no discharge. No scleral icterus.  Neck: Normal range of motion. Neck supple. No tracheal deviation present. No thyromegaly present.  Cardiovascular: Normal rate, regular rhythm, normal heart sounds and intact distal pulses.  Exam reveals no gallop and no friction rub.   No murmur heard. Respiratory: Effort normal and breath sounds normal. No respiratory distress. She has no wheezes. She has no rales. She exhibits no tenderness.  GI: Soft. Bowel sounds are normal. She exhibits no distension and no mass. There is no tenderness. There is no rebound and no guarding.  Genitourinary:  Breasts no masses skin changes or nipple changes bilaterally      Vulva is normal  without lesions Vagina is pink moist without discharge Cervix normal in appearance and pap is done Uterus is normal size shape and contour extreme retroversion Adnexa is negative with normal sized ovaries   Musculoskeletal: Normal range of motion. She exhibits no edema and no tenderness.  Neurological: She is alert and oriented to person, place, and time. She has normal reflexes. She displays normal reflexes. No cranial nerve deficit. She exhibits normal muscle tone. Coordination normal.  Skin: Skin is warm and dry. No rash noted. No erythema. No pallor.  Psychiatric: She has a normal mood and affect. Her behavior is normal. Judgment and thought content normal.       Medications Ordered at today's visit: No orders of the defined types were placed in this encounter.   Other orders placed at today's visit: Orders Placed This Encounter  Procedures   US  PELVIC COMPLETE WITH TRANSVAGINAL     ASSESSMENT + PLAN:    ICD-10-CM   1. Well woman exam with routine gynecological exam  Z01.419 Cytology - PAP    2. Pelvic pain, intermenstrual  R10.20 US  PELVIC COMPLETE WITH TRANSVAGINAL          Return in about 2 weeks (around 11/27/2023) for GYN sono, Follow up, with Dr Jayne.

## 2023-11-18 LAB — CYTOLOGY - PAP
Comment: NEGATIVE
Diagnosis: NEGATIVE
Diagnosis: REACTIVE
High risk HPV: NEGATIVE

## 2023-11-19 ENCOUNTER — Ambulatory Visit (HOSPITAL_COMMUNITY): Payer: Self-pay | Admitting: Obstetrics & Gynecology

## 2023-11-25 ENCOUNTER — Other Ambulatory Visit: Payer: Self-pay | Admitting: Internal Medicine

## 2023-12-12 ENCOUNTER — Ambulatory Visit (INDEPENDENT_AMBULATORY_CARE_PROVIDER_SITE_OTHER): Admitting: Obstetrics & Gynecology

## 2023-12-12 ENCOUNTER — Encounter: Payer: Self-pay | Admitting: Obstetrics & Gynecology

## 2023-12-12 ENCOUNTER — Ambulatory Visit (INDEPENDENT_AMBULATORY_CARE_PROVIDER_SITE_OTHER)

## 2023-12-12 VITALS — BP 129/86 | HR 76 | Ht 70.0 in | Wt 166.0 lb

## 2023-12-12 DIAGNOSIS — D369 Benign neoplasm, unspecified site: Secondary | ICD-10-CM

## 2023-12-12 DIAGNOSIS — N8003 Adenomyosis of the uterus: Secondary | ICD-10-CM | POA: Diagnosis not present

## 2023-12-12 DIAGNOSIS — N926 Irregular menstruation, unspecified: Secondary | ICD-10-CM | POA: Diagnosis not present

## 2023-12-12 DIAGNOSIS — R102 Pelvic and perineal pain unspecified side: Secondary | ICD-10-CM

## 2023-12-12 NOTE — Progress Notes (Signed)
 PELVIC US  TA/TV: homogeneous retroverted uterus,two posterior 3 mm myometrial cysts,normal right ovary,simple left ovarian cyst 2.6 x 1.1 x 2 cm,ovaries appear mobile,no free fluid,EEC 6.9 mm

## 2023-12-12 NOTE — Progress Notes (Signed)
 Follow up appointment for results: sonogram  Chief Complaint  Patient presents with   Follow-up    Blood pressure 129/86, pulse 76, height 5' 10 (1.778 m), weight 166 lb (75.3 kg), last menstrual period 11/08/2023.  US  PELVIC COMPLETE WITH TRANSVAGINAL Result Date: 12/12/2023 Images from the original result were not included.  ..[facility] [employer] [facility]trasound Medicine TECHNICAL SALES ENGINEER) accredited practice [facility] [address] [place] [address] Ordering Provider: Jayne Vonn DEL, MD                                                                                                                                   GYNECOLOGIC SONOGRAM EXAM: TRANSABDOMINAL AND TRANSVAGINAL ULTRASOUND OF PELVIS  TECHNIQUE: Transabdominal and transvaginal ultrasound examinations of the pelvis were performed. Transabdominal technique was performed for global imaging of the pelvis including uterus, ovaries, adnexal regions, and pelvic cul-de-sac. It was necessary to proceed with endovaginal exam following the transabdominal exam to better visualize and improve detailed examination of  the uterus, endometrium and bilateral ovaries, as well as, evaluation of ovarian blood flow and sliding characteristics. Nancy Welch is a 48 y.o. [id number] Patient's last menstrual period was 11/08/2023. She is here for a pelvic sonogram for irregular periods with pelvic pain. Uterus                      6.2 x 5.7 x 4.8 cm, Total uterine volume 90 cc, homogeneous retroverted uterus,two posterior 3 mm simple myometrial cysts Endometrium          6.9 mm, symmetrical, WNL Right ovary             2.8 x 1.5 x 2.1 cm, normal Left ovary                2.8 x 2 x 2.3 cm, normal No free fluid Technician Comments: PELVIC US  TA/TV: homogeneous retroverted uterus,two posterior 3 mm simple myometrial cysts,normal right ovary,simple left ovarian cyst 2.6 x 1.1 x 2 cm,ovaries appear mobile,no free fluid,EEC 6.9 mm Amber J  Carl 12/12/2023 9:15 AM Clinical Impression and recommendations: I have reviewed the sonogram results above, combined with the patient's current clinical course, below are my impressions and any appropriate recommendations for management based on the sonographic findings. Uterus normal size shape and contour, several areas of adenomyosis/adenomyomas Endometrium normal width and morphology Ovaries: normal size shape and morphology physiologic left ovarian cyst, no follow up evaluation is required for this size and appearance Vonn DEL Jayne 12/12/2023 9:56 AM     Adenomyosis/adenomyomas explain her increase cramping At this point Kaylyne does not think she wants any management, we discussed Mirena, POP, and she will take a wait and see approach for now  MEDS ordered this encounter: No orders of the defined types were placed in this encounter.   Orders for this encounter: No orders of the defined types were placed  in this encounter.   Impression + Management Plan   ICD-10-CM   1. Pelvic pain, intermenstrual  R10.20     2. Adenomyosis. uterus  N80.03     3. Adenomyoma. uterus  D36.9       Follow Up: Return if symptoms worsen or fail to improve.     All questions were answered.  Past Medical History:  Diagnosis Date   Allergy 2014   Pork   Anxiety 2009   Post partum   Bicuspid aortic valve    a. 2001 Echo ([place]): bicuspid AoV reportedly noted; b. 09/2018 Echo: EF 60-65%, Nl RV fxn, mild TR; c. 01/2018 cMR: EF 61%, RVEF 50%. bicuspid AoV w/o significant stenosis or regurg. Mildly dil asc Ao - 4.2cm.   Depression 2009   Post partum   Dilation of thoracic aorta    a.10/2018 cMR: Asc Ao 4.2cm; b. 11/2019 MRA Chest: Aneurysmal dzs of Asc thoracic Ao - max diam 4.3-4.4cm.   Heart murmur 1993   Hyperlipidemia 2021   Hypertension 2020   Panic attacks    PVC's (premature ventricular contractions)    Vaginal Pap smear, abnormal     Past Surgical History:  Procedure Laterality  Date   BREAST CYST ASPIRATION Right 2007   COLPOSCOPY     DILATION AND EVACUATION N/A 12/31/2012   Procedure: DILATATION AND EVACUATION;  Surgeon: Norleen LULLA Server, MD;  Location: [facility];  Service: Gynecology;  Laterality: N/A;   ovarian cyst removed     L side     OB History     Gravida  5   Para  2   Term  2   Preterm      AB  3   Living  2      SAB  3   IAB      Ectopic      Multiple  0   Live Births  2           Allergies  Allergen Reactions   Escitalopram Oxalate Nausea Only    Dizziness/ vertigo.    Porcine (Pork) Protein-Containing Drug Products     Anaphylaxis     Social History   Socioeconomic History   Marital status: Married    Spouse name: Not on file   Number of children: 2   Years of education: Not on file   Highest education level: Bachelor's degree (e.g., BA, AB, BS)  Occupational History   Not on file  Tobacco Use   Smoking status: Former    Passive exposure: Past   Smokeless tobacco: Never   Tobacco comments:    smoked 1/2 ppd for 5 years; quit in 2005   Vaping Use   Vaping status: Never Used  Substance and Sexual Activity   Alcohol use: Yes    Comment: 1-2 glasses of red wine per week   Drug use: Never   Sexual activity: Yes    Birth control/protection: None, Other-see comments    Comment: vasectomy  Other Topics Concern   Not on file  Social History Narrative   Married; full time - lab tech; 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    Social Drivers of Corporate Investment Banker Strain: Low Risk  (11/13/2023)   Overall Financial Resource Strain (CARDIA)    Difficulty of Paying Living Expenses: Not very hard  Food Insecurity: No Food Insecurity (11/13/2023)   Hunger Vital Sign    Worried About Running Out of Food in the  Last Year: Never true    Ran Out of Food in the Last Year: Never true  Transportation Needs: No Transportation Needs (11/13/2023)   PRAPARE - Administrator, Civil Service  (Medical): No    Lack of Transportation (Non-Medical): No  Physical Activity: Insufficiently Active (11/13/2023)   Exercise Vital Sign    Days of Exercise per Week: 3 days    Minutes of Exercise per Session: 30 min  Stress: No Stress Concern Present (11/13/2023)   [employer] of Occupational Health - Occupational Stress Questionnaire    Feeling of Stress: Only a little  Social Connections: Moderately Integrated (11/13/2023)   Social Connection and Isolation Panel    Frequency of Communication with Friends and Family: Once a week    Frequency of Social Gatherings with Friends and Family: Once a week    Attends Religious Services: 1 to 4 times per year    Active Member of [employer] or Organizations: Yes    Attends Banker Meetings: 1 to 4 times per year    Marital Status: Married    Family History  Problem Relation Age of Onset   Heart attack Paternal Grandfather    Early death Paternal Grandfather    Breast cancer Paternal Grandmother        55's   Hypertension Mother    Uterine cancer Mother    Squamous cell carcinoma Mother    Breast cancer Sister    Arrhythmia Sister    Lupus Sister    Rheum arthritis Sister

## 2023-12-19 ENCOUNTER — Ambulatory Visit: Admitting: Student

## 2023-12-31 ENCOUNTER — Ambulatory Visit: Attending: Internal Medicine | Admitting: Internal Medicine

## 2023-12-31 ENCOUNTER — Encounter: Payer: Self-pay | Admitting: Internal Medicine

## 2023-12-31 VITALS — BP 115/80 | HR 63 | Ht 70.0 in | Wt 167.2 lb

## 2023-12-31 DIAGNOSIS — Q2381 Bicuspid aortic valve: Secondary | ICD-10-CM | POA: Diagnosis not present

## 2023-12-31 DIAGNOSIS — I493 Ventricular premature depolarization: Secondary | ICD-10-CM | POA: Diagnosis not present

## 2023-12-31 DIAGNOSIS — I7121 Aneurysm of the ascending aorta, without rupture: Secondary | ICD-10-CM | POA: Diagnosis not present

## 2023-12-31 DIAGNOSIS — I10 Essential (primary) hypertension: Secondary | ICD-10-CM | POA: Diagnosis not present

## 2023-12-31 DIAGNOSIS — I712 Thoracic aortic aneurysm, without rupture, unspecified: Secondary | ICD-10-CM

## 2023-12-31 MED ORDER — DIAZEPAM 2 MG PO TABS: ORAL_TABLET | ORAL | 0 refills | Status: AC

## 2023-12-31 NOTE — Patient Instructions (Addendum)
 Medication Instructions:  Your physician recommends that you continue on your current medications as directed. Please refer to the Current Medication list given to you today.    You may take Valium  2 mg for 1 dose prior to procedure   *If you need a refill on your cardiac medications before your next appointment, please call your pharmacy*  Lab Work: No labs ordered today    Testing/Procedures:   You are scheduled for MRA Chest at the location below.     [facility] [address] [place], [place] [address] Please go to the [facility] and check-in with the desk attendant.   Magnetic resonance imaging (MRI) is a painless test that produces images of the inside of the body without using Xrays.  During an MRI, strong magnets and radio waves work together in a data processing manager to form detailed images.   MRI images may provide more details about a medical condition than X-rays, CT scans, and ultrasounds can provide.  You may be given earphones to listen for instructions.  You may eat a light breakfast and take medications as ordered with the exception of furosemide, hydrochlorothiazide, chlorthalidone or spironolactone (or any other fluid pill). If you are undergoing a stress MRI, please avoid stimulants for 12 hr prior to test. (I.e. Caffeine, nicotine, chocolate, or antihistamine medications)  If your provider has ordered anti-anxiety medications for this test, then you will need a driver.  An IV will be inserted into one of your veins. Contrast material will be injected into your IV. It will leave your body through your urine within a day. You may be told to drink plenty of fluids to help flush the contrast material out of your system.  You will be asked to remove all metal, including: Watch, jewelry, and other metal objects including hearing aids, hair pieces and dentures. Also wearable glucose monitoring systems (ie. Freestyle Libre and Omnipods)  (Braces and fillings normally are not a problem.)   TEST WILL TAKE APPROXIMATELY 1 HOUR  PLEASE NOTIFY SCHEDULING AT LEAST 24 HOURS IN ADVANCE IF YOU ARE UNABLE TO KEEP YOUR APPOINTMENT. [phone]  For more information and frequently asked questions, please visit our website : [email or web address]  Please call the Cardiac Imaging Nurse Navigators with any questions/concerns. [phone] Office     Follow-Up: At [facility], you and your health needs are our priority.  As part of our continuing mission to provide you with exceptional heart care, our providers are all part of one team.  This team includes your primary Cardiologist (physician) and Advanced Practice Providers or APPs (Physician Assistants and Nurse Practitioners) who all work together to provide you with the care you need, when you need it.  Your next appointment:   As needed  Provider:   Lonni Hanson, MD

## 2023-12-31 NOTE — Progress Notes (Signed)
**Note Nancy-Identified via Obfuscation**  Cardiology Office Note:  .   Date:  12/31/2023  ID:  Nancy Welch, DOB [birth year], MRN [id number] PCP: Nancy Cuba, Raymond J, MD  [facility] Providers Cardiologist:  Lonni Hanson, MD     History of Present Illness: .   Nancy Welch is a 48 y.o. female with history of bicuspid aortic valve, thoracic aortic aneurysm, hypertension, depression, and anxiety, who presents for follow-up of aortic valve/thoracic aortic disease.  I last saw her in 08/2022, at which time she reported an episode of anterior and left-sided chest pain that she awoke with a few days before our visit.  The pain lasted about a day and was different than her previous chest tightness and intermittent palpitations attributed to PVCs.  We agreed to obtain a coronary CTA for evaluation of her coronary arteries and to reassess her thoracic aortic aneurysm.  Echo was also obtained; see details below.  Today, Ms. Coval reports that she has been feeling well without chest pain, shortness of breath, lightheadedness, and edema.  She has stable sporadic self-limited palpitations without associated symptoms.  She is recovering from COVID-19 infection about 1 week ago.  She notes that she will be switching insurance next year and that we will no longer be in her network.  ROS: See HPI  Studies Reviewed: SABRA   EKG Interpretation Date/Time:  Wednesday December 31 2023 15:33:34 EST Ventricular Rate:  63 PR Interval:  144 QRS Duration:  92 QT Interval:  426 QTC Calculation: 435 R Axis:   11  Text Interpretation: Normal sinus rhythm Nonspecific T wave abnormality Abnormal ECG When compared with ECG of 20-Sep-2022 16:27, Premature ventricular complexes are no longer Present Premature atrial complexes are no longer Present Confirmed by Marti Acebo, Lonni [phone]) on 12/31/2023 3:39:56 PM    TTE (10/22/2022): Normal LV size and wall thickness.  LVEF 55-60% with normal wall motion and diastolic parameters.  Global longitudinal strain  -14.0%.  Normal RV size and function.  Moderate left atrial enlargement.  Mild mitral and tricuspid regurgitation.  Aortic sclerosis without stenosis noted (indeterminate number of cusps).  Mean gradient 10 mmHg.  Mild dilation of the acing aorta at 4.4 cm.  Coronary CTA (10/03/2022): Normal coronary arteries without plaque or stenosis.  Coronary calcium  score 0.  Moderately dilated ascending aorta measuring 4.5 cm.  Bicuspid aortic valve noted.  Risk Assessment/Calculations:             Physical Exam:   VS:  BP 115/80 (BP Location: Left Arm, Patient Position: Sitting, Cuff Size: Normal)   Pulse 63 Comment: 68 oximeter  Ht 5' 10 (1.778 m)   Wt 167 lb 3.2 oz (75.8 kg)   LMP 11/08/2023 (Exact Date)   SpO2 99%   BMI 23.99 kg/m    Wt Readings from Last 3 Encounters:  12/31/23 167 lb 3.2 oz (75.8 kg)  12/12/23 166 lb (75.3 kg)  11/13/23 164 lb (74.4 kg)    General:  NAD. Neck: No JVD or HJR. Lungs: Clear to auscultation bilaterally without wheezes or crackles. Heart: Regular rate and rhythm with 1/6 systolic murmur. Abdomen: Soft, nontender, nondistended. Extremities: No lower extremity edema.  ASSESSMENT AND PLAN: .    Bicuspid aortic valve and thoracic aortic aneurysm: No sx reported.  Echo in 09/2022 showed no significant aortic valve stenosis with mild MR and normal LVEF.  Ascending aorta was mildly dilated at 4.5 cm on CTA at that time as well.  We will plan to obtain an MRA of the thoracic aorta  for follow-up (ideally before 01/2024 when her insurance switches).  She will need to establish with a new cardiology provider in her insurance network for ongoing surveillance.  Continue BP control with metoprolol  and losartan .  Recommend avoidance of fluoroquinolones if possible.  Prescription for prn diazepam  provided for management of claustrophobia associated with MRA.  PVC's: Mild and self-limited.  Continue metoprolol  succinate 25 mg daily.  Hyperlipidemia: Continue rosuvastatin  5  mg daily; LDL reasonable on last check in 01/2023 at 78.    Dispo: Follow-up as needed with up.  Ms. Chandonnet should establish with new in-network cardiology provider in ~1 year for ongoing surveillance of bicuspid AoV and ascending aortic aneurysm.  Signed, Lonni Hanson, MD

## 2024-01-02 ENCOUNTER — Encounter: Payer: Self-pay | Admitting: Internal Medicine

## 2024-01-14 ENCOUNTER — Other Ambulatory Visit: Payer: Self-pay | Admitting: Internal Medicine

## 2024-01-14 ENCOUNTER — Other Ambulatory Visit (HOSPITAL_BASED_OUTPATIENT_CLINIC_OR_DEPARTMENT_OTHER): Payer: Self-pay | Admitting: Family Medicine

## 2024-02-24 ENCOUNTER — Other Ambulatory Visit: Payer: Self-pay | Admitting: Internal Medicine

## 2024-02-24 ENCOUNTER — Encounter: Payer: Self-pay | Admitting: Internal Medicine
